# Patient Record
Sex: Male | Born: 1960 | Race: White | Hispanic: No | Smoking: Current every day smoker
Health system: Southern US, Community
[De-identification: ages and names within clinical notes are randomized; demographics above are authoritative.]

## PROBLEM LIST (undated history)

## (undated) DIAGNOSIS — E785 Hyperlipidemia, unspecified: Secondary | ICD-10-CM

## (undated) DIAGNOSIS — I2510 Atherosclerotic heart disease of native coronary artery without angina pectoris: Secondary | ICD-10-CM

## (undated) DIAGNOSIS — I10 Essential (primary) hypertension: Secondary | ICD-10-CM

## (undated) HISTORY — PX: CORONARY ANGIOPLASTY WITH STENT PLACEMENT: SHX49

---

## 1998-08-10 HISTORY — PX: TOTAL HIP ARTHROPLASTY: SHX124

## 2011-08-23 ENCOUNTER — Inpatient Hospital Stay: Payer: Self-pay | Admitting: Internal Medicine

## 2011-08-23 LAB — COMPREHENSIVE METABOLIC PANEL
Anion Gap: 15 (ref 7–16)
BUN: 16 mg/dL (ref 7–18)
Chloride: 101 mmol/L (ref 98–107)
Co2: 23 mmol/L (ref 21–32)
EGFR (African American): >60
EGFR (Non-African Amer.): >60
Glucose: 121 mg/dL — ABNORMAL HIGH (ref 65–99)
Osmolality: 280 (ref 275–301)
SGOT(AST): 34 U/L (ref 15–37)
SGPT (ALT): 52 U/L

## 2011-08-23 LAB — URINALYSIS, COMPLETE
Bacteria: NONE SEEN
Glucose,UR: NEGATIVE mg/dL (ref 0–75)
Hyaline Cast: 1
Ketone: NEGATIVE
Leukocyte Esterase: NEGATIVE
Nitrite: NEGATIVE
Ph: 6 (ref 4.5–8.0)
Specific Gravity: 1.003 (ref 1.003–1.030)

## 2011-08-23 LAB — CBC
HCT: 44.6 % (ref 40.0–52.0)
HGB: 15.8 g/dL (ref 13.0–18.0)
MCH: 32 pg (ref 26.0–34.0)
RBC: 4.93 10*6/uL (ref 4.40–5.90)
RDW: 13.7 % (ref 11.5–14.5)
WBC: 15.1 10*3/uL — ABNORMAL HIGH (ref 3.8–10.6)

## 2011-08-23 LAB — CK TOTAL AND CKMB (NOT AT ARMC)
CK, Total: 193 U/L (ref 35–232)
CK, Total: 438 U/L — ABNORMAL HIGH (ref 35–232)
CK-MB: 32.5 ng/mL — ABNORMAL HIGH (ref 0.5–3.6)

## 2011-08-23 LAB — TROPONIN I
Troponin-I: <0.02 ng/mL
Troponin-I: 5 ng/mL — ABNORMAL HIGH

## 2011-08-23 LAB — APTT: Activated PTT: 38.9 secs — ABNORMAL HIGH (ref 23.6–35.9)

## 2012-04-10 ENCOUNTER — Emergency Department: Payer: Self-pay | Admitting: Emergency Medicine

## 2012-04-10 LAB — URINALYSIS, COMPLETE
Ketone: NEGATIVE
Leukocyte Esterase: NEGATIVE
Nitrite: NEGATIVE
Ph: 6 (ref 4.5–8.0)
Protein: NEGATIVE
RBC,UR: 10 /HPF (ref 0–5)

## 2012-04-10 LAB — COMPREHENSIVE METABOLIC PANEL
Albumin: 3.8 g/dL (ref 3.4–5.0)
Alkaline Phosphatase: 69 U/L (ref 50–136)
Anion Gap: 8 (ref 7–16)
Calcium, Total: 8.6 mg/dL (ref 8.5–10.1)
Chloride: 109 mmol/L — ABNORMAL HIGH (ref 98–107)
Co2: 24 mmol/L (ref 21–32)
EGFR (African American): >60
Osmolality: 282 (ref 275–301)
Potassium: 3.5 mmol/L (ref 3.5–5.1)
SGOT(AST): 27 U/L (ref 15–37)
SGPT (ALT): 40 U/L (ref 12–78)
Sodium: 141 mmol/L (ref 136–145)
Total Protein: 7.1 g/dL (ref 6.4–8.2)

## 2012-04-10 LAB — CBC
HCT: 41.7 % (ref 40.0–52.0)
HGB: 14.8 g/dL (ref 13.0–18.0)
MCH: 31.2 pg (ref 26.0–34.0)
MCHC: 35.4 g/dL (ref 32.0–36.0)
MCV: 88 fL (ref 80–100)
Platelet: 280 10*3/uL (ref 150–440)
RBC: 4.74 10*6/uL (ref 4.40–5.90)
WBC: 10.2 10*3/uL (ref 3.8–10.6)

## 2012-04-10 LAB — PROTIME-INR: Prothrombin Time: 12.1 secs (ref 11.5–14.7)

## 2012-04-17 ENCOUNTER — Emergency Department: Payer: Self-pay | Admitting: Emergency Medicine

## 2012-04-17 LAB — COMPREHENSIVE METABOLIC PANEL
Alkaline Phosphatase: 58 U/L (ref 50–136)
Anion Gap: 8 (ref 7–16)
Bilirubin,Total: 0.5 mg/dL (ref 0.2–1.0)
Chloride: 106 mmol/L (ref 98–107)
Co2: 27 mmol/L (ref 21–32)
Creatinine: 1.11 mg/dL (ref 0.60–1.30)
EGFR (Non-African Amer.): >60
Glucose: 102 mg/dL — ABNORMAL HIGH (ref 65–99)
Osmolality: 284 (ref 275–301)
Potassium: 3.8 mmol/L (ref 3.5–5.1)
SGOT(AST): 20 U/L (ref 15–37)
Sodium: 141 mmol/L (ref 136–145)

## 2012-04-17 LAB — URINALYSIS, COMPLETE
Bacteria: NONE SEEN
Bilirubin,UR: NEGATIVE
Glucose,UR: NEGATIVE mg/dL (ref 0–75)
Ketone: NEGATIVE
Leukocyte Esterase: NEGATIVE
Nitrite: NEGATIVE
RBC,UR: 6 /HPF (ref 0–5)
Specific Gravity: 1.02 (ref 1.003–1.030)
Squamous Epithelial: NONE SEEN
WBC UR: 1 /HPF (ref 0–5)

## 2012-04-17 LAB — CBC
HCT: 44.5 % (ref 40.0–52.0)
HGB: 15.2 g/dL (ref 13.0–18.0)
MCH: 30.8 pg (ref 26.0–34.0)
MCHC: 34.2 g/dL (ref 32.0–36.0)
MCV: 90 fL (ref 80–100)
Platelet: 314 10*3/uL (ref 150–440)
WBC: 17.4 10*3/uL — ABNORMAL HIGH (ref 3.8–10.6)

## 2014-12-02 NOTE — Consult Note (Signed)
PATIENT NAME:  Anthony Mcguire, Anthony Mcguire MR#:  [id number] DATE OF BIRTH:  [birth year]  DATE OF CONSULTATION:  08/23/2011  REFERRING PHYSICIAN:   CONSULTING PHYSICIAN:  Laurier NancyShaukat A. Barba Solt, MD  HISTORY OF PRESENT ILLNESS: This is a 54 year old white male from [place] who came in with substernal chest pain described as pressure-type associated with shortness of breath and diaphoresis. It was 6/10 chest pain. Initially he was having burning then pressure-type.   ALLERGIES: No known drug allergies.   SOCIAL HISTORY: He smokes 1 pack per day. He denies EtOH abuse.   FAMILY HISTORY: Positive for coronary artery disease. His grandfather had a myocardial infarction at a young age, in his 6940s.   PHYSICAL EXAMINATION:   GENERAL: He is alert and oriented x3, in no acute distress.   VITAL SIGNS: Stable.   NECK: No JVD.   LUNGS: Clear.   HEART: Regular rate and rhythm. Normal S1 and S2. No murmur.   ABDOMEN: Soft and nontender, positive bowel sounds.   EXTREMITIES: No pedal edema.   NEUROLOGIC: Appears to be intact.   LABS/STUDIES: EKG shows sinus bradycardia at 55 beats per minute, early repolarization abnormality. Initial EKG had ST elevation at 3 mm which was read as early repolarization; however, it is probably related to acute myocardial infarction. Follow-up EKG still has persistent ST elevations.   The initial troponin was negative, but the second set came back as CPK 438, MB 32.5 and troponin 5./  ASSESSMENT AND PLAN: Probably the patient is having STEMI. I advised transferring the patient to [facility]. ____________________________ Laurier NancyShaukat A. Dreonna Hussein, MD sak:slb D: 08/23/2011 13:06:29 ET T: 08/23/2011 13:36:05 ET JOB#: [id number]  cc: Laurier NancyShaukat A. Eldridge Marcott, MD, <Dictator> Laurier NancySHAUKAT A [place] Lesure MD ELECTRONICALLY SIGNED 09/08/2011 9:00

## 2014-12-02 NOTE — Consult Note (Signed)
    General Aspect Consult dictated,[id number] came with chest pain and initial troponin was normal, and second set had troponin of 5, thus asked to evaluate. He is still having ongoing chest pain, and initial EKG had more than 3 mm Anteroseptal st elevation with f/u EKGS not much changed. Patient is having STEMI. [facility] and activated STEMI protocol. Also spoke to Dr. Dava NajjarPanwar and will transfer ASAP. Also started asp 325.plavix 600, IV heparin, and integrilin.    No Known Allergies:   Electronic Signatures: Radene KneeKhan, Tabor Bartram Ali (MD)  (Signed 13-Jan-13 13:30)  Authored: General Aspect/Present Illness, Allergies   Last Updated: 13-Jan-13 13:30 by Radene KneeKhan, Reyna Lorenzi Ali (MD)

## 2014-12-02 NOTE — H&P (Signed)
PATIENT NAME:  Anthony Mcguire, Anthony Mcguire MR#:  [id number] DATE OF BIRTH:  1960-11-16  DATE OF ADMISSION:  08/23/2011  PRIMARY CARE PHYSICIAN: Unassigned.  EMERGENCY ROOM PHYSICIAN: Dr. Marilynne Halsted  CHIEF COMPLAINT: Chest pain.   HISTORY OF PRESENT ILLNESS: The patient is a 54 year old male who presents with the chief complaint of substernal chest pain localized more on the right side. Symptoms began about 3:30 in the morning on exertion associated with some shortness of breath. The patient's pain radiated to the right shoulder, severity 6/10. The patient denies any nausea, diaphoresis, or dizziness. He denies palpitations. The chest pain was constant. The patient denies any other aggravating or alleviating factors.   ALLERGIES: No known drug allergies.   CURRENT MEDICATIONS: None.   PAST MEDICAL HISTORY: History of hypertension. The patient stopped taking blood pressure medications one year ago.   SOCIAL HISTORY: The patient is a smoker of one pack per day. He denies alcohol abuse or drug abuse. He works with [employer].   FAMILY HISTORY: The patient's mother is 57 years and is healthy. Father is in his 71s and has hypertension.   REVIEW OF SYSTEMS: CONSTITUTIONAL: The patient denies fevers, chills, or night sweats. HEENT: The patient denies any hearing loss, dysphagia, visual problems, or sore throat. CARDIOVASCULAR: Denies any chest pain, orthopnea, or PND. RESPIRATORY: The patient denies any cough, wheezing, or hemoptysis. GI: The patient denies any nausea, vomiting, abdominal pain, hematemesis, hematochezia, or melena. GU: The patient denies any hematuria, dysuria, or frequency. NEUROLOGIC: The patient denies any headache, focal weakness, or seizures. SKIN: The patient denies lesions or rash. ENDOCRINE: The patient denies polyuria, polyphagia, or polydipsia. MUSCULOSKELETAL: The patient denies any arthritis, joint effusion, or swelling. HEMATOLOGICAL: The patient denies any easy bleeding or bruises.    PHYSICAL EXAMINATION:   VITAL SIGNS: Heart rate 48, respiratory rate 18, blood pressure 127/79, and oxygen saturation 99%.   HEENT: Atraumatic, normocephalic. Pupils are equally round and reactive to light and accommodation. Extraocular movements are intact. Sclerae anicteric. Mucous membranes moist.   NECK: Supple. No organomegaly.   HEART: S1 and S2 regular rate and rhythm. No gallops. No thrills. No murmurs.   RESPIRATORY: Lungs are clear to auscultation. No rales, no rhonchi, no wheezes, and no bronchial breath sounds.   GI: Abdomen is soft, nontender, and nondistended. Normal bowel sounds. No hepatosplenomegaly.   GU: No hematuria or masses are noted.   SKIN: No lesions, no rash.   ENDOCRINE: No masses and no thyromegaly is noted.   LYMPH: No lymphadenopathy or nodes palpable.   NEUROLOGIC: Cranial nerves II through XII grossly intact. Motor strength 5 out of 5 in bilateral upper and lower extremities. Sensation is within normal limits. No focal neurological deficits are noted on examination.   MUSCULOSKELETAL: No arthritis, joint effusion, or swelling.   HEMATOLOGICAL: No ecchymosis, no bleeding, and no petechiae.   EXTREMITIES: No cyanosis, no clubbing, and no edema. 2+ pedal pulses noted bilaterally.   LABS/STUDIES: Chest x-ray is negative, per emergency room report.   Electrocardiogram: Normal sinus rhythm, 82 beats per minute, nonspecific ST-T wave changes.   Urinalysis shows 4 RBC's.   Total CK 193, CK-MB 1.2. Glucose 121, BUN 16, creatinine 1.13, sodium 139, potassium 3.6, chloride 101, CO2 23, calcium 8.4, total bilirubin 0.6, alkaline phosphatase 75, ALT 52, AST 34, total protein 7.6, and albumin 4.0. Estimated GFR greater than 60.   ASSESSMENT AND PLAN: The patient is a 54 year old male who presents with the chief complaint of chest pain: We will  admit to telemetry. Start the patient on chest pain clinical pathway orders, aspirin, and Lopressor. Check serial  cardiac enzymes and troponins. Echo. Myoview stress test. Cardiology consultation.  ____________________________ Donia AstJignesh S. Anan Dapolito, MD jsp:slb D: 08/23/2011 07:07:53 ET T: 08/23/2011 09:31:11 ET JOB#: [id number]  cc: Donia AstJignesh S. Rossi Silvestro, MD, <Dictator> Donia AstJIGNESH S Jaime Grizzell MD ELECTRONICALLY SIGNED 08/23/2011 22:09

## 2014-12-02 NOTE — Discharge Summary (Signed)
PATIENT NAME:  Anthony Mcguire, Anthony MR#:  [id number] DATE OF BIRTH:  10-Dec-1960  DATE OF ADMISSION:  08/23/2011 DATE OF DISCHARGE:  08/23/2011  DISCHARGE DIAGNOSES:  1. ST-segment elevation myocardial infarction. 2. Hypertension.  3. Extensive history of smoking. 4. Family history of heart disease. 5. Gastroesophageal reflux disease. 6. Hyperglycemia.   DISPOSITION: The patient is being [facility] for treatment of his STEMI and for angioplasty.  CONSULTANT: Adrian BlackwaterShaukat Khan, MD - Cardiology.  RESULTS: Chest x-ray showed no acute cardiopulmonary disease.  Urinalysis showed no evidence of infection.   CBC is normal other than white count of 15.1. First CK and troponin were negative. The second CK was 438 and troponin 5. Basic metabolic panel essentially normal, other than glucose of 121.  DISCHARGE MEDICATIONS: 1. Plavix 600 mg p.o. x1 dose. 2. Heparin drip after bolus.  3. Tylenol 650 mg every 4 hours p.r.n.  4. Xanax 0.25 mg every 8 hours p.r.n.  5. Maalox 30 mL every 4 hours p.r.n. indigestion.  6. Aspirin 325 mg daily.  7. Colace 100 mg twice a day. 8. Lisinopril 10 mg daily.  9. Milk of Magnesia 30 mL p.r.n. for constipation.  10. Lopressor 12.5 mg twice a day. 11. Nicotine patch 21 mg daily.  12. Nitro patch 1 inch every 6 hours. 13. Nitroglycerin p.r.n.  14. Omeprazole 20 mg twice a day. 15. Phenergan 12.5 mg IV every 4 to 6 hours p.r.n.  16. Simvastatin 20 mg daily.  17. Ambien 10 mg at bedtime p.r.n. for insomnia.   HOSPITAL COURSE: The patient is a 54 year old male with history of hypertension and smoking. The patient is originally from [place] and has been in the [place]. He was diagnosed with hypertension four years ago and started taking medications about one year ago, which were prescribed by his physician in [place]. He does not remember the name of the medications. He reports that he had a stress test four years ago  in [place]. He smokes two packs per day. His grandfather had a myocardial infarction in his 7240s. He presented with chest pain described as heartburn. He gave history of occasional gastroesophageal reflux disease/heartburn for which he took Zantac as needed. He had eaten some food yesterday which normally causes indigestion, however, he came to the hospital because his symptoms did not resolve. The patient was admitted to the hospital given his risk factors. His first set of troponin was negative. However, his second set of troponin came positive at 5. The patient was started on aspirin, beta blocker, ACE inhibitor, statin, and nitro paste, and he was bolused with 5000 units of heparin and started on a heparin drip. A cardiology consultation with Dr. Adrian BlackwaterShaukat Khan was also obtained. The patient was evaluated by Dr. Adrian BlackwaterShaukat Khan who reviewed his EKG and noted that the patient had 4 mm ST elevation. He thought the patient was having a ST-segment elevation myocardial infarction therefore [facility] was contacted. [facility]. He has been given a loading dose of Plavix.   TIME SPENT: 60 minutes.  ____________________________ Darrick MeigsSangeeta Braelon Sprung, MD sp:slb D: 08/23/2011 13:50:19 ET    T: 08/23/2011 13:58:18 ET       JOB#: [id number] cc: Darrick MeigsSangeeta Shaquila Sigman, MD, <Dictator> Darrick MeigsSANGEETA Johnnisha Forton MD ELECTRONICALLY SIGNED 08/23/2011 20:02

## 2017-03-18 ENCOUNTER — Emergency Department: Payer: Self-pay

## 2017-03-18 ENCOUNTER — Encounter: Payer: Self-pay | Admitting: Emergency Medicine

## 2017-03-18 ENCOUNTER — Emergency Department
Admission: EM | Admit: 2017-03-18 | Discharge: 2017-03-18 | Disposition: A | Discharge status: Home / Self Care | Payer: Self-pay | Attending: Emergency Medicine | Admitting: Emergency Medicine

## 2017-03-18 DIAGNOSIS — R1011 Right upper quadrant pain: Secondary | ICD-10-CM

## 2017-03-18 DIAGNOSIS — M62830 Muscle spasm of back: Secondary | ICD-10-CM | POA: Insufficient documentation

## 2017-03-18 DIAGNOSIS — S335XXA Sprain of ligaments of lumbar spine, initial encounter: Secondary | ICD-10-CM

## 2017-03-18 DIAGNOSIS — I2510 Atherosclerotic heart disease of native coronary artery without angina pectoris: Secondary | ICD-10-CM | POA: Insufficient documentation

## 2017-03-18 DIAGNOSIS — K8020 Calculus of gallbladder without cholecystitis without obstruction: Secondary | ICD-10-CM | POA: Insufficient documentation

## 2017-03-18 DIAGNOSIS — F17200 Nicotine dependence, unspecified, uncomplicated: Secondary | ICD-10-CM | POA: Insufficient documentation

## 2017-03-18 DIAGNOSIS — R319 Hematuria, unspecified: Secondary | ICD-10-CM | POA: Insufficient documentation

## 2017-03-18 HISTORY — DX: Atherosclerotic heart disease of native coronary artery without angina pectoris: I25.10

## 2017-03-18 LAB — HEPATIC FUNCTION PANEL
ALBUMIN: 4.1 g/dL (ref 3.5–5.0)
ALT: 29 U/L (ref 17–63)
AST: 24 U/L (ref 15–41)
Alkaline Phosphatase: 56 U/L (ref 38–126)
Bilirubin, Direct: 0.1 mg/dL (ref 0.1–0.5)
Indirect Bilirubin: 0.8 mg/dL (ref 0.3–0.9)
TOTAL PROTEIN: 7.1 g/dL (ref 6.5–8.1)
Total Bilirubin: 0.9 mg/dL (ref 0.3–1.2)

## 2017-03-18 LAB — CBC WITH DIFFERENTIAL/PLATELET
BASOS ABS: 0 10*3/uL (ref 0–0.1)
BASOS PCT: 0 %
EOS ABS: 0.2 10*3/uL (ref 0–0.7)
Eosinophils Relative: 3 %
HEMATOCRIT: 41.3 % (ref 40.0–52.0)
HEMOGLOBIN: 14.3 g/dL (ref 13.0–18.0)
Lymphocytes Relative: 42 %
Lymphs Abs: 3.6 10*3/uL (ref 1.0–3.6)
MCH: 30.4 pg (ref 26.0–34.0)
MCHC: 34.7 g/dL (ref 32.0–36.0)
MCV: 87.6 fL (ref 80.0–100.0)
MONOS PCT: 13 %
Monocytes Absolute: 1.1 10*3/uL — ABNORMAL HIGH (ref 0.2–1.0)
NEUTROS ABS: 3.5 10*3/uL (ref 1.4–6.5)
NEUTROS PCT: 42 %
Platelets: 299 10*3/uL (ref 150–440)
RBC: 4.72 MIL/uL (ref 4.40–5.90)
RDW: 13.7 % (ref 11.5–14.5)
WBC: 8.4 10*3/uL (ref 3.8–10.6)

## 2017-03-18 LAB — BASIC METABOLIC PANEL
ANION GAP: 8 (ref 5–15)
BUN: 14 mg/dL (ref 6–20)
CALCIUM: 9.2 mg/dL (ref 8.9–10.3)
CO2: 25 mmol/L (ref 22–32)
CREATININE: 1.19 mg/dL (ref 0.61–1.24)
Chloride: 106 mmol/L (ref 101–111)
GFR calc non Af Amer: >60 mL/min (ref >60)
Glucose, Bld: 87 mg/dL (ref 65–99)
Potassium: 4 mmol/L (ref 3.5–5.1)
SODIUM: 139 mmol/L (ref 135–145)

## 2017-03-18 LAB — URINALYSIS, COMPLETE (UACMP) WITH MICROSCOPIC
BACTERIA UA: NONE SEEN
BILIRUBIN URINE: NEGATIVE
Glucose, UA: NEGATIVE mg/dL
KETONES UR: NEGATIVE mg/dL
LEUKOCYTES UA: NEGATIVE
Nitrite: NEGATIVE
PH: 6 (ref 5.0–8.0)
PROTEIN: NEGATIVE mg/dL
SQUAMOUS EPITHELIAL / LPF: NONE SEEN
Specific Gravity, Urine: 1.015 (ref 1.005–1.030)

## 2017-03-18 LAB — LIPASE, BLOOD: LIPASE: 38 U/L (ref 11–51)

## 2017-03-18 MED ORDER — CYCLOBENZAPRINE HCL 10 MG PO TABS: 10.0000 mg | ORAL_TABLET | Freq: Three times a day (TID) | ORAL | 0 refills | Status: AC | PRN

## 2017-03-18 MED ORDER — KETOROLAC TROMETHAMINE 30 MG/ML IJ SOLN
INTRAMUSCULAR | Status: AC
  Administered 2017-03-18: 15 mg via INTRAVENOUS
  Filled 2017-03-18: qty 1

## 2017-03-18 MED ORDER — MORPHINE SULFATE (PF) 4 MG/ML IV SOLN
4.0000 mg | Freq: Once | INTRAVENOUS | Status: AC
  Administered 2017-03-18: 4 mg via INTRAVENOUS

## 2017-03-18 MED ORDER — ONDANSETRON HCL 4 MG/2ML IJ SOLN
4.0000 mg | Freq: Once | INTRAMUSCULAR | Status: AC
  Administered 2017-03-18: 4 mg via INTRAVENOUS

## 2017-03-18 MED ORDER — ONDANSETRON HCL 4 MG/2ML IJ SOLN
INTRAMUSCULAR | Status: AC
  Administered 2017-03-18: 4 mg via INTRAVENOUS
  Filled 2017-03-18: qty 2

## 2017-03-18 MED ORDER — KETOROLAC TROMETHAMINE 30 MG/ML IJ SOLN
15.0000 mg | Freq: Once | INTRAMUSCULAR | Status: AC
  Administered 2017-03-18: 15 mg via INTRAVENOUS

## 2017-03-18 MED ORDER — DICLOFENAC SODIUM 1 % TD GEL: 2.0000 g | Freq: Four times a day (QID) | TRANSDERMAL | 0 refills | Status: AC

## 2017-03-18 MED ORDER — MORPHINE SULFATE (PF) 4 MG/ML IV SOLN
INTRAVENOUS | Status: AC
  Administered 2017-03-18: 4 mg via INTRAVENOUS
  Filled 2017-03-18: qty 1

## 2017-03-18 NOTE — ED Triage Notes (Signed)
Pt with left sided flank pain for two days.

## 2017-03-18 NOTE — ED Notes (Signed)

## 2017-03-18 NOTE — ED Provider Notes (Signed)
[facility] Emergency Department Provider Note  ____________________________________________  Time seen: Approximately 8:22 PM  I have reviewed the triage vital signs and the nursing notes.   HISTORY  Chief Complaint Flank Pain   HPI Baran Kuhrt is a 56 y.o. male with a history of coronary artery disease and presents for evaluation of left flank pain and RUQ abdominal pain. Patient reports several episodes of cramping sharp sudden right upper quadrant abdominal pain usually worse postprandially that has been happening for several months. Patient currently endorses mild right upper quadrant pain. Patient also complaining of left middle back/flank pain. Patient has a right hip replacement and according to him since having that done he walks crooked. He has been walking a lot in the last few days prior to developing the left back pain. He is complaining of 6 out of 10 dull pain on his L back that is worse with palpation and movement. No SOB, no CP, no personal or family history of blood clots, no recent travel immobilization, no leg pain or swelling, no hemoptysis, no exogenous hormones. Patient reports that he was told many years ago by his doctor in [place] that he had blood in his urine. He had an extensive workup which was unrevealing. He denies prior history of kidney stone. He denies prior abdominal surgeries. He denies dysuria or hematuria. No fever or chills. No nausea or vomiting. No constipation or diarrhea.  Past Medical History:  Diagnosis Date  . Coronary artery disease     There are no active problems to display for this patient.   History reviewed. No pertinent surgical history.  Prior to Admission medications   Medication Sig Start Date End Date Taking? Authorizing Provider  cyclobenzaprine (FLEXERIL) 10 MG tablet Take 1 tablet (10 mg total) by mouth 3 (three) times daily as needed for muscle spasms. 03/18/17   Nita Sickle, MD  diclofenac  sodium (VOLTAREN) 1 % GEL Apply 2 g topically 4 (four) times daily. 03/18/17   Nita Sickle, MD    Allergies Patient has no known allergies.  History reviewed. No pertinent family history.  Social History Social History  Substance Use Topics  . Smoking status: Current Some Day Smoker  . Smokeless tobacco: Never Used  . Alcohol use Yes    Review of Systems  Constitutional: Negative for fever. Eyes: Negative for visual changes. ENT: Negative for sore throat. Neck: No neck pain  Cardiovascular: Negative for chest pain. Respiratory: Negative for shortness of breath. Gastrointestinal: + RUQ abdominal pain. No vomiting or diarrhea. Genitourinary: Negative for dysuria. + L flank pain Musculoskeletal: Negative for back pain. Skin: Negative for rash. Neurological: Negative for headaches, weakness or numbness. Psych: No SI or HI  ____________________________________________   PHYSICAL EXAM:  VITAL SIGNS: ED Triage Vitals  Enc Vitals Group     BP 03/18/17 1804 (!) 149/82     Pulse Rate 03/18/17 1804 66     Resp 03/18/17 1804 18     Temp 03/18/17 1804 98.5 F (36.9 C)     Temp Source 03/18/17 1804 Oral     SpO2 03/18/17 1804 98 %     Weight 03/18/17 1804 235 lb (106.6 kg)     Height 03/18/17 1804 5\' 11"  (1.803 m)     Head Circumference --      Peak Flow --      Pain Score 03/18/17 1803 7     Pain Loc --      Pain Edu? --  Excl. in GC? --     Constitutional: Alert and oriented. Well appearing and in no apparent distress. HEENT:      Head: Normocephalic and atraumatic.         Eyes: Conjunctivae are normal. Sclera is non-icteric.       Mouth/Throat: Mucous membranes are moist.       Neck: Supple with no signs of meningismus. Cardiovascular: Regular rate and rhythm. No murmurs, gallops, or rubs. 2+ symmetrical distal pulses are present in all extremities. No JVD. Respiratory: Normal respiratory effort. Lungs are clear to auscultation bilaterally. No wheezes,  crackles, or rhonchi.  Gastrointestinal: Soft, ttp over the right upper quadrant with negative Murphy sign, and non distended with positive bowel sounds. No rebound or guarding. Genitourinary: L CVA tenderness. Musculoskeletal: Nontender with normal range of motion in all extremities. No edema, cyanosis, or erythema of extremities. Neurologic: Normal speech and language. Face is symmetric. Moving all extremities. No gross focal neurologic deficits are appreciated. Skin: Skin is warm, dry and intact. No rash noted. Psychiatric: Mood and affect are normal. Speech and behavior are normal.  ____________________________________________   LABS (all labs ordered are listed, but only abnormal results are displayed)  Labs Reviewed  URINALYSIS, COMPLETE (UACMP) WITH MICROSCOPIC - Abnormal; Notable for the following:       Result Value   Color, Urine YELLOW (*)    APPearance CLEAR (*)    Hgb urine dipstick MODERATE (*)    All other components within normal limits  CBC WITH DIFFERENTIAL/PLATELET - Abnormal; Notable for the following:    Monocytes Absolute 1.1 (*)    All other components within normal limits  BASIC METABOLIC PANEL  HEPATIC FUNCTION PANEL  LIPASE, BLOOD   ____________________________________________  EKG  none  ____________________________________________  RADIOLOGY  CT renal: 1. No evidence of acute abnormality. 2. Hepatic steatosis. 3. Gallbladder sludge versus cholelithiasis. No CT evidence of acute cholecystitis. 4. Nonobstructing 3 mm right renal calculus. 5. Aortic Atherosclerosis (ICD10-I70.0).  RUQ [place]: Cholelithiasis without other evidence of acute cholecystitis. ____________________________________________   PROCEDURES  Procedure(s) performed: None Procedures Critical Care performed:  None ____________________________________________   INITIAL IMPRESSION / ASSESSMENT AND PLAN / ED COURSE  56 y.o. male with a history of coronary artery disease and  presents for evaluation of left flank pain and RUQ abdominal pain.  #L flank pain: Hematuria seen on UA however patient has a h/o hematuria in the past. CT renal showing R renal stone, no abnormalities on the L. Pain reproducible on palpation and I do believe pain is MSK in nature vs possible passed L sided kidney stone since patient's pain has now resolved after pain meds. Will refer to Urology for evaluation of hematuria. No suspicion of PE with non pleuritic pain, no SOB, no CP, no tachycardia, no tachypnea, no risk factors.  #RUQ abdominal pain: CT concerning for cholelithiasis, LFTs and lipase pending. Will send for RUQ [place].     _________________________ 9:34 PM on 03/18/2017 ----------------------------------------- US showing cholelithiasis with no cholecystitis. Will refer to Dr. Excell Seltzer for outpatient evaluation. Referral to Dr. Apolinar Junes for hematuria eval. Will give flexeril and voltaren gel for muscle spasms of the back. Discussed return precautions with patient.   Pertinent labs & imaging results that were available during my care of the patient were reviewed by me and considered in my medical decision making (see chart for details).    ____________________________________________   FINAL CLINICAL IMPRESSION(S) / ED DIAGNOSES  Final diagnoses:  RUQ abdominal pain  Calculus  of gallbladder without cholecystitis without obstruction  Lumbar sprain, initial encounter  Hematuria, unspecified type      NEW MEDICATIONS STARTED DURING THIS VISIT:  New Prescriptions   CYCLOBENZAPRINE (FLEXERIL) 10 MG TABLET    Take 1 tablet (10 mg total) by mouth 3 (three) times daily as needed for muscle spasms.   DICLOFENAC SODIUM (VOLTAREN) 1 % GEL    Apply 2 g topically 4 (four) times daily.     Note:  This document was prepared using Dragon voice recognition software and may include unintentional dictation errors.    Nita SickleVeronese, [place], MD 03/18/17 2136

## 2017-03-19 ENCOUNTER — Emergency Department
Admission: EM | Admit: 2017-03-19 | Discharge: 2017-03-20 | Disposition: A | Discharge status: Home / Self Care | Payer: Self-pay | Attending: Emergency Medicine | Admitting: Emergency Medicine

## 2017-03-19 DIAGNOSIS — R1032 Left lower quadrant pain: Secondary | ICD-10-CM | POA: Insufficient documentation

## 2017-03-19 DIAGNOSIS — I2510 Atherosclerotic heart disease of native coronary artery without angina pectoris: Secondary | ICD-10-CM | POA: Insufficient documentation

## 2017-03-19 DIAGNOSIS — R3129 Other microscopic hematuria: Secondary | ICD-10-CM | POA: Insufficient documentation

## 2017-03-19 DIAGNOSIS — R109 Unspecified abdominal pain: Secondary | ICD-10-CM

## 2017-03-19 LAB — CBC
HCT: 41.8 % (ref 40.0–52.0)
Hemoglobin: 14.4 g/dL (ref 13.0–18.0)
MCH: 30.3 pg (ref 26.0–34.0)
MCHC: 34.4 g/dL (ref 32.0–36.0)
MCV: 88 fL (ref 80.0–100.0)
PLATELETS: 303 10*3/uL (ref 150–440)
RBC: 4.74 MIL/uL (ref 4.40–5.90)
RDW: 14 % (ref 11.5–14.5)
WBC: 8.5 10*3/uL (ref 3.8–10.6)

## 2017-03-19 LAB — BASIC METABOLIC PANEL
Anion gap: 9 (ref 5–15)
BUN: 14 mg/dL (ref 6–20)
CALCIUM: 9.2 mg/dL (ref 8.9–10.3)
CO2: 24 mmol/L (ref 22–32)
CREATININE: 1.22 mg/dL (ref 0.61–1.24)
Chloride: 107 mmol/L (ref 101–111)
GFR calc non Af Amer: >60 mL/min (ref >60)
Glucose, Bld: 94 mg/dL (ref 65–99)
Potassium: 4.1 mmol/L (ref 3.5–5.1)
SODIUM: 140 mmol/L (ref 135–145)

## 2017-03-19 MED ORDER — FENTANYL CITRATE (PF) 100 MCG/2ML IJ SOLN
50.0000 ug | INTRAMUSCULAR | Status: DC | PRN
  Administered 2017-03-19: 50 ug via NASAL
  Filled 2017-03-19: qty 2

## 2017-03-19 NOTE — ED Triage Notes (Signed)
Pt with left flank pain that began today. Pt states "the pain is unbearable". Pt with history of right renal calculi diagnosed yesterday per pt.

## 2017-03-20 ENCOUNTER — Emergency Department: Payer: Self-pay

## 2017-03-20 ENCOUNTER — Encounter: Payer: Self-pay | Admitting: Radiology

## 2017-03-20 LAB — URINALYSIS, COMPLETE (UACMP) WITH MICROSCOPIC
BILIRUBIN URINE: NEGATIVE
Bacteria, UA: NONE SEEN
GLUCOSE, UA: NEGATIVE mg/dL
KETONES UR: NEGATIVE mg/dL
LEUKOCYTES UA: NEGATIVE
NITRITE: NEGATIVE
PH: 7 (ref 5.0–8.0)
Protein, ur: NEGATIVE mg/dL
SPECIFIC GRAVITY, URINE: 1.017 (ref 1.005–1.030)
SQUAMOUS EPITHELIAL / LPF: NONE SEEN

## 2017-03-20 MED ORDER — ONDANSETRON 4 MG PO TBDP: 4.0000 mg | ORAL_TABLET | Freq: Three times a day (TID) | ORAL | 0 refills | Status: AC | PRN

## 2017-03-20 MED ORDER — IOPAMIDOL (ISOVUE-300) INJECTION 61%: 30.0000 mL | Freq: Once | INTRAVENOUS | Status: DC

## 2017-03-20 MED ORDER — KETOROLAC TROMETHAMINE 30 MG/ML IJ SOLN
10.0000 mg | Freq: Once | INTRAMUSCULAR | Status: AC
  Administered 2017-03-20: 9.9 mg via INTRAVENOUS
  Filled 2017-03-20: qty 1

## 2017-03-20 MED ORDER — ONDANSETRON HCL 4 MG/2ML IJ SOLN
4.0000 mg | Freq: Once | INTRAMUSCULAR | Status: AC
  Administered 2017-03-20: 4 mg via INTRAVENOUS
  Filled 2017-03-20: qty 2

## 2017-03-20 MED ORDER — IOPAMIDOL (ISOVUE-300) INJECTION 61%
100.0000 mL | Freq: Once | INTRAVENOUS | Status: AC | PRN
  Administered 2017-03-20: 100 mL via INTRAVENOUS

## 2017-03-20 MED ORDER — IBUPROFEN 800 MG PO TABS: 800.0000 mg | ORAL_TABLET | Freq: Three times a day (TID) | ORAL | 0 refills | Status: AC | PRN

## 2017-03-20 MED ORDER — HYDROMORPHONE HCL 1 MG/ML IJ SOLN
0.5000 mg | Freq: Once | INTRAMUSCULAR | Status: AC
  Administered 2017-03-20: 0.5 mg via INTRAVENOUS
  Filled 2017-03-20: qty 1

## 2017-03-20 MED ORDER — OXYCODONE-ACETAMINOPHEN 5-325 MG PO TABS: 1.0000 | ORAL_TABLET | ORAL | 0 refills | Status: AC | PRN

## 2017-03-20 MED ORDER — SODIUM CHLORIDE 0.9 % IV BOLUS (SEPSIS)
1000.0000 mL | Freq: Once | INTRAVENOUS | Status: AC
  Administered 2017-03-20: 1000 mL via INTRAVENOUS

## 2017-03-20 NOTE — Discharge Instructions (Signed)
1. You may take medicines as needed for pain and nausea (Percocet/Zofran #30). 2. Drink plenty of filtered or bottled water daily. 3. Return to the ER for worsening symptoms, persistent vomiting, difficulty breathing or other concerns.

## 2017-03-20 NOTE — ED Provider Notes (Signed)
[facility] Emergency Department Provider Note   ____________________________________________   First MD Initiated Contact with Patient 03/20/17 0011     (approximate)  I have reviewed the triage vital signs and the nursing notes.   HISTORY  Chief Complaint Flank Pain    HPI Anthony Mcguire is a 56 y.o. male who presents to the ED from home with a chief complaint of left flank pain.Reports a several day history of left flank pain. States he was seen in the ED yesterday with negative CT scan for kidney stone and sent home on muscle relaxers. CT and subsequent [place] did demonstrate cholelithiasis. Reports waxing/waning pain to left flank with associated nausea. Denies fever, chills, chest pain, shortness of breath, vomiting, hematuria, testicular pain or swelling. Denies recent travel or trauma. Denies similar pain previously.   Past Medical History CAD  There are no active problems to display for this patient.   No past surgical history on file.  Prior to Admission medications   Not on File    Allergies Patient has no known allergies.  Family History None for kidney stones  Social History Social History  Substance Use Topics  . Smoking status: Not on file  . Smokeless tobacco: Not on file  . Alcohol use Not on file    Review of Systems  Constitutional: No fever/chills. Eyes: No visual changes. ENT: No sore throat. Cardiovascular: Denies chest pain. Respiratory: Denies shortness of breath. Gastrointestinal: Positive for left flank pain. No abdominal pain.  Positive for nausea, no vomiting.  No diarrhea.  No constipation. Genitourinary: Negative for dysuria. Musculoskeletal: Negative for back pain. Skin: Negative for rash. Neurological: Negative for headaches, focal weakness or numbness.   ____________________________________________   PHYSICAL EXAM:  VITAL SIGNS: ED Triage Vitals  Enc Vitals Group     BP 03/19/17 2256 (!)  165/85     Pulse Rate 03/19/17 2256 74     Resp 03/19/17 2256 16     Temp 03/19/17 2256 98.3 F (36.8 C)     Temp Source 03/19/17 2256 Oral     SpO2 03/19/17 2256 100 %     Weight 03/19/17 2257 245 lb (111.1 kg)     Height 03/19/17 2257 5\' 11"  (1.803 m)     Head Circumference --      Peak Flow --      Pain Score 03/19/17 2256 10     Pain Loc --      Pain Edu? --      Excl. in GC? --     Constitutional: Alert and oriented. Well appearing and in mild acute distress. Eyes: Conjunctivae are normal. PERRL. EOMI. Head: Atraumatic. Nose: No congestion/rhinnorhea. Mouth/Throat: Mucous membranes are moist.  Oropharynx non-erythematous. Neck: No stridor.   Cardiovascular: Normal rate, regular rhythm. Grossly normal heart sounds.  Good peripheral circulation. Respiratory: Normal respiratory effort.  No retractions. Lungs CTAB. Gastrointestinal: Soft and nontender. No distention. No abdominal bruits. Mild left CVA tenderness. Musculoskeletal: No lower extremity tenderness nor edema.  No joint effusions. Neurologic:  Normal speech and language. No gross focal neurologic deficits are appreciated. No gait instability. Skin:  Skin is warm, dry and intact. No rash noted. No vesicles. Psychiatric: Mood and affect are normal. Speech and behavior are normal.  ____________________________________________   LABS (all labs ordered are listed, but only abnormal results are displayed)  Labs Reviewed  URINALYSIS, COMPLETE (UACMP) WITH MICROSCOPIC - Abnormal; Notable for the following:       Result Value  Color, Urine YELLOW (*)    APPearance CLEAR (*)    Hgb urine dipstick SMALL (*)    All other components within normal limits  BASIC METABOLIC PANEL  CBC   ____________________________________________  EKG  None ____________________________________________  RADIOLOGY  Ct Abdomen Pelvis W Contrast  Result Date: 03/20/2017 CLINICAL DATA:  Acute onset of left flank pain.  Initial  encounter. EXAM: CT ABDOMEN AND PELVIS WITH CONTRAST TECHNIQUE: Multidetector CT imaging of the abdomen and pelvis was performed using the standard protocol following bolus administration of intravenous contrast. CONTRAST:  100mL ISOVUE-300 IOPAMIDOL (ISOVUE-300) INJECTION 61% COMPARISON:  None. FINDINGS: Lower chest: The visualized lung bases are grossly clear. Mild coronary artery calcification is noted. Hepatobiliary: The liver is unremarkable in appearance. The gallbladder is unremarkable in appearance. The common bile duct remains normal in caliber. Pancreas: The pancreas is within normal limits. Spleen: The spleen is unremarkable in appearance. Adrenals/Urinary Tract: The adrenal glands are unremarkable in appearance. A right renal cyst is noted. Mild nonspecific perinephric stranding is noted bilaterally. A 4 mm nonobstructing stone is noted at the upper pole of the right kidney. There is no evidence of hydronephrosis. No obstructing ureteral stones are seen. Stomach/Bowel: The stomach is unremarkable in appearance. The small bowel is within normal limits. The appendix is normal in caliber, without evidence of appendicitis. The colon is unremarkable in appearance. Vascular/Lymphatic: Scattered calcification is seen along the abdominal aorta and its branches. The abdominal aorta is otherwise grossly unremarkable. There is likely moderate or severe luminal narrowing along the right common iliac artery. The inferior vena cava is grossly unremarkable. No retroperitoneal lymphadenopathy is seen. No pelvic sidewall lymphadenopathy is identified. Reproductive: The bladder is mildly distended and grossly unremarkable. The prostate remains normal in size. Other: No additional soft tissue abnormalities are seen. Musculoskeletal: No acute osseous abnormalities are identified. The patient's right hip arthroplasty is grossly intact, without evidence of loosening. Surrounding degenerative change is noted. The visualized  musculature is unremarkable in appearance. IMPRESSION: 1. No acute abnormality seen to explain the patient's symptoms. 2. Mild coronary artery calcification noted. 3. 4 mm nonobstructing stone at the upper pole of the right kidney. Right renal cyst noted. 4. Scattered aortic atherosclerosis. 5. Likely moderate or severe luminal narrowing along the right common iliac artery. Would correlate for any associated symptoms. Electronically Signed   By: Roanna RaiderJeffery  Chang M.D.   On: 03/20/2017 01:31    ____________________________________________   PROCEDURES  Procedure(s) performed: None  Procedures  Critical Care performed: No  ____________________________________________   INITIAL IMPRESSION / ASSESSMENT AND PLAN / ED COURSE  Pertinent labs & imaging results that were available during my care of the patient were reviewed by me and considered in my medical decision making (see chart for details).  56 year old male who returns to the ED for persistent left flank pain. CT renal colic study yesterday negative for left-sided stone. Laboratory results unremarkable. Microscopic hematuria noted on urinalysis which patient has had previously. Given patient's persistent pain, will obtain CT with IV contrast to evaluate for renal infarction.  Clinical Course as of Mar 20 153  Sat Mar 20, 2017  0149 Pain almost gone. Slightly nauseous. Updated patient and spouse of CT imaging results.Will treat like a kidney stone and discharge home on Percocet and Zofran as needed. Patient was referred yesterday to both urology as well as general surgery for cholelithiasis seen on CT scan. He will follow up as instructed. Strict return precautions given. Both verbalize understanding and agree with plan  of care.  [JS]    Clinical Course User Index [JS] Irean Hong, MD     ____________________________________________   FINAL CLINICAL IMPRESSION(S) / ED DIAGNOSES  Final diagnoses:  Left flank pain  Microscopic  hematuria      NEW MEDICATIONS STARTED DURING THIS VISIT:  New Prescriptions   No medications on file     Note:  This document was prepared using Dragon voice recognition software and may include unintentional dictation errors.    Irean Hong, MD 03/20/17 [phone]

## 2017-03-24 ENCOUNTER — Encounter: Payer: Self-pay | Admitting: Radiology

## 2017-08-10 DIAGNOSIS — K8020 Calculus of gallbladder without cholecystitis without obstruction: Secondary | ICD-10-CM

## 2017-08-10 DIAGNOSIS — N200 Calculus of kidney: Secondary | ICD-10-CM

## 2017-08-10 HISTORY — DX: Calculus of kidney: N20.0

## 2017-08-10 HISTORY — DX: Calculus of gallbladder without cholecystitis without obstruction: K80.20

## 2017-10-10 ENCOUNTER — Emergency Department: Payer: Self-pay

## 2017-10-10 ENCOUNTER — Encounter: Payer: Self-pay | Admitting: Emergency Medicine

## 2017-10-10 ENCOUNTER — Emergency Department
Admission: EM | Admit: 2017-10-10 | Discharge: 2017-10-10 | Disposition: A | Discharge status: Home / Self Care | Payer: Self-pay | Attending: Student in an Organized Health Care Education/Training Program | Admitting: Student in an Organized Health Care Education/Training Program

## 2017-10-10 DIAGNOSIS — Z955 Presence of coronary angioplasty implant and graft: Secondary | ICD-10-CM | POA: Insufficient documentation

## 2017-10-10 DIAGNOSIS — I10 Essential (primary) hypertension: Secondary | ICD-10-CM | POA: Insufficient documentation

## 2017-10-10 DIAGNOSIS — J40 Bronchitis, not specified as acute or chronic: Secondary | ICD-10-CM | POA: Insufficient documentation

## 2017-10-10 DIAGNOSIS — F17200 Nicotine dependence, unspecified, uncomplicated: Secondary | ICD-10-CM | POA: Insufficient documentation

## 2017-10-10 DIAGNOSIS — I2510 Atherosclerotic heart disease of native coronary artery without angina pectoris: Secondary | ICD-10-CM | POA: Insufficient documentation

## 2017-10-10 HISTORY — DX: Hyperlipidemia, unspecified: E78.5

## 2017-10-10 HISTORY — DX: Essential (primary) hypertension: I10

## 2017-10-10 LAB — BASIC METABOLIC PANEL
ANION GAP: 10 (ref 5–15)
BUN: 13 mg/dL (ref 6–20)
CO2: 22 mmol/L (ref 22–32)
Calcium: 8.9 mg/dL (ref 8.9–10.3)
Chloride: 104 mmol/L (ref 101–111)
Creatinine, Ser: 1.13 mg/dL (ref 0.61–1.24)
GFR calc Af Amer: >60 mL/min (ref >60)
GFR calc non Af Amer: >60 mL/min (ref >60)
GLUCOSE: 126 mg/dL — AB (ref 65–99)
POTASSIUM: 3.7 mmol/L (ref 3.5–5.1)
Sodium: 136 mmol/L (ref 135–145)

## 2017-10-10 LAB — CBC
HEMATOCRIT: 39.9 % — AB (ref 40.0–52.0)
HEMOGLOBIN: 13.5 g/dL (ref 13.0–18.0)
MCH: 29.3 pg (ref 26.0–34.0)
MCHC: 33.7 g/dL (ref 32.0–36.0)
MCV: 86.9 fL (ref 80.0–100.0)
Platelets: 337 10*3/uL (ref 150–440)
RBC: 4.59 MIL/uL (ref 4.40–5.90)
RDW: 14.3 % (ref 11.5–14.5)
WBC: 10.1 10*3/uL (ref 3.8–10.6)

## 2017-10-10 LAB — TROPONIN I: Troponin I: <0.03 ng/mL (ref <0.03)

## 2017-10-10 MED ORDER — DOXYCYCLINE HYCLATE 100 MG PO TABS
100.0000 mg | ORAL_TABLET | Freq: Once | ORAL | Status: AC
  Administered 2017-10-10: 100 mg via ORAL
  Filled 2017-10-10: qty 1

## 2017-10-10 MED ORDER — PREDNISONE 20 MG PO TABS
60.0000 mg | ORAL_TABLET | Freq: Once | ORAL | Status: AC
  Administered 2017-10-10: 60 mg via ORAL
  Filled 2017-10-10: qty 3

## 2017-10-10 MED ORDER — IOPAMIDOL (ISOVUE-370) INJECTION 76%
75.0000 mL | Freq: Once | INTRAVENOUS | Status: AC | PRN
  Administered 2017-10-10: 75 mL via INTRAVENOUS

## 2017-10-10 MED ORDER — PREDNISONE 20 MG PO TABS: 40.0000 mg | ORAL_TABLET | Freq: Every day | ORAL | 0 refills | Status: AC

## 2017-10-10 MED ORDER — IPRATROPIUM-ALBUTEROL 0.5-2.5 (3) MG/3ML IN SOLN
3.0000 mL | Freq: Once | RESPIRATORY_TRACT | Status: AC
  Administered 2017-10-10: 3 mL via RESPIRATORY_TRACT
  Filled 2017-10-10: qty 3

## 2017-10-10 MED ORDER — DOXYCYCLINE HYCLATE 100 MG PO TABS: 100.0000 mg | ORAL_TABLET | Freq: Two times a day (BID) | ORAL | 0 refills | Status: AC

## 2017-10-10 MED ORDER — ALBUTEROL SULFATE HFA 108 (90 BASE) MCG/ACT IN AERS: 2.0000 | INHALATION_SPRAY | Freq: Four times a day (QID) | RESPIRATORY_TRACT | 2 refills | Status: AC | PRN

## 2017-10-10 NOTE — ED Triage Notes (Signed)
Pt comes into the ED via POV c/o sharp pains in his chest and coughing up blood for the past week.  Patient has even and unlabored respirations at this time and appears in NAD. Patient has cardiac stents and is on plavix for them.

## 2017-10-10 NOTE — ED Provider Notes (Signed)
[facility] Emergency Department Provider Note    None    (approximate)  I have reviewed the triage vital signs and the nursing notes.   HISTORY  Chief Complaint Chest Pain and Hemoptysis    HPI Anthony Mcguire is a 57 y.o. male with a history of CAD on aspirin Plavix presents with chief complaint of several weeks of intermittent right-sided chest pain with shortness of breath and cough developing hemoptysis where he is coughing up streaks of blood.  Has also had sinus congestion as well as muscle aches.  No recent diagnosis of pneumonia.  He does smoke daily and has had a history of bronchitis.  No lower extremity swelling.  No sudden weight loss.  No night sweats.  No nausea or vomiting.  Past Medical History:  Diagnosis Date  . Coronary artery disease   . Hyperlipidemia   . Hypertension    No family history on file. Past Surgical History:  Procedure Laterality Date  . CORONARY ANGIOPLASTY WITH STENT PLACEMENT     There are no active problems to display for this patient.     Prior to Admission medications   Medication Sig Start Date End Date Taking? Authorizing Provider  albuterol (PROVENTIL HFA;VENTOLIN HFA) 108 (90 Base) MCG/ACT inhaler Inhale 2 puffs into the lungs every 6 (six) hours as needed for wheezing or shortness of breath. 10/10/17   Willy Eddy, MD  cyclobenzaprine (FLEXERIL) 10 MG tablet Take 1 tablet (10 mg total) by mouth 3 (three) times daily as needed for muscle spasms. 03/18/17   Nita Sickle, MD  diclofenac sodium (VOLTAREN) 1 % GEL Apply 2 g topically 4 (four) times daily. 03/18/17   Nita Sickle, MD  doxycycline (VIBRA-TABS) 100 MG tablet Take 1 tablet (100 mg total) by mouth 2 (two) times daily for 7 days. 10/10/17 10/17/17  Willy Eddy, MD  ibuprofen (ADVIL,MOTRIN) 800 MG tablet Take 1 tablet (800 mg total) by mouth every 8 (eight) hours as needed for moderate pain. 03/20/17   Irean Hong, MD  ondansetron  (ZOFRAN ODT) 4 MG disintegrating tablet Take 1 tablet (4 mg total) by mouth every 8 (eight) hours as needed for nausea or vomiting. 03/20/17   Irean Hong, MD  oxyCODONE-acetaminophen (ROXICET) 5-325 MG tablet Take 1 tablet by mouth every 4 (four) hours as needed for severe pain. 03/20/17   Irean Hong, MD  predniSONE (DELTASONE) 20 MG tablet Take 2 tablets (40 mg total) by mouth daily for 4 days. 10/10/17 10/14/17  Willy Eddy, MD    Allergies Patient has no known allergies.    Social History Social History   Tobacco Use  . Smoking status: Current Every Day Smoker  . Smokeless tobacco: Never Used  Substance Use Topics  . Alcohol use: Yes  . Drug use: No    Review of Systems Patient denies headaches, rhinorrhea, blurry vision, numbness, shortness of breath, chest pain, edema, cough, abdominal pain, nausea, vomiting, diarrhea, dysuria, fevers, rashes or hallucinations unless otherwise stated above in HPI. ____________________________________________   PHYSICAL EXAM:  VITAL SIGNS: Vitals:   10/10/17 1218  BP: (!) 166/87  Pulse: 79  Resp: 18  Temp: 98.1 F (36.7 C)  SpO2: 99%    Constitutional: Alert and oriented. Well appearing and in no acute distress. Eyes: Conjunctivae are normal.  Head: Atraumatic. Nose: No congestion/rhinnorhea. Mouth/Throat: Mucous membranes are moist.   Neck: No stridor. Painless ROM.  Cardiovascular: Normal rate, regular rhythm. Grossly normal heart sounds.  Good peripheral  circulation. Respiratory: Normal respiratory effort.  No retractions. Lungs with occasional faint wheeze Gastrointestinal: Soft and nontender. No distention. No abdominal bruits. No CVA tenderness. Genitourinary:  Musculoskeletal: No lower extremity tenderness nor edema.  No joint effusions. Neurologic:  Normal speech and language. No gross focal neurologic deficits are appreciated. No facial droop Skin:  Skin is warm, dry and intact. No rash noted. Psychiatric: Mood and  affect are normal. Speech and behavior are normal.  ____________________________________________   LABS (all labs ordered are listed, but only abnormal results are displayed)  Results for orders placed or performed during the hospital encounter of 10/10/17 (from the past 24 hour(s))  Basic metabolic panel     Status: Abnormal   Collection Time: 10/10/17 12:14 PM  Result Value Ref Range   Sodium 136 135 - 145 mmol/L   Potassium 3.7 3.5 - 5.1 mmol/L   Chloride 104 101 - 111 mmol/L   CO2 22 22 - 32 mmol/L   Glucose, Bld 126 (H) 65 - 99 mg/dL   BUN 13 6 - 20 mg/dL   Creatinine, Ser 0.98 0.61 - 1.24 mg/dL   Calcium 8.9 8.9 - 11.9 mg/dL   GFR calc non Af Amer >60 >60 mL/min   GFR calc Af Amer >60 >60 mL/min   Anion gap 10 5 - 15  CBC     Status: Abnormal   Collection Time: 10/10/17 12:14 PM  Result Value Ref Range   WBC 10.1 3.8 - 10.6 K/uL   RBC 4.59 4.40 - 5.90 MIL/uL   Hemoglobin 13.5 13.0 - 18.0 g/dL   HCT 14.7 (L) 82.9 - 56.2 %   MCV 86.9 80.0 - 100.0 fL   MCH 29.3 26.0 - 34.0 pg   MCHC 33.7 32.0 - 36.0 g/dL   RDW 13.0 86.5 - 78.4 %   Platelets 337 150 - 440 K/uL  Troponin I     Status: None   Collection Time: 10/10/17 12:14 PM  Result Value Ref Range   Troponin I <0.03 <0.03 ng/mL   ____________________________________________  EKG My review and personal interpretation at Time: 12:20   Indication: chest pain  Rate: 70  Rhythm: sinus Axis: normal Other: no stemi, normal intervals, non sepcific st changes ____________________________________________  RADIOLOGY  I personally reviewed all radiographic images ordered to evaluate for the above acute complaints and reviewed radiology reports and findings.  These findings were personally discussed with the patient.  Please see medical record for radiology report.  ____________________________________________   PROCEDURES  Procedure(s) performed:  Procedures    Critical Care performed:  no ____________________________________________   INITIAL IMPRESSION / ASSESSMENT AND PLAN / ED COURSE  Pertinent labs & imaging results that were available during my care of the patient were reviewed by me and considered in my medical decision making (see chart for details).  DDX: ACS, pericarditis, esophagitis, boerhaaves, pe, dissection, pna, bronchitis, costochondritis   Anthony Mcguire is a 57 y.o. who presents to the ED with presents with hemoptysis as described above.  Patient in no acute distress.  No respiratory distress.  Chest x-ray orchitis possible pneumonia.  Based on hemoptysis on blood thinners heavy smoker patient is at high risk for PE therefore CT angiogram ordered to further risk stratify does show evidence of probable inflammatory versus pneumonia therefore will start on doxycycline.  No evidence of PE or aneurysm or dissection.  Patient otherwise stable and appropriate for outpatient management as he has no acute hypoxia.  Discussed follow-up with PCP and signs and  symptoms for which he should return immediately to the hospital.      ____________________________________________   FINAL CLINICAL IMPRESSION(S) / ED DIAGNOSES  Final diagnoses:  Bronchitis      NEW MEDICATIONS STARTED DURING THIS VISIT:  New Prescriptions   ALBUTEROL (PROVENTIL HFA;VENTOLIN HFA) 108 (90 BASE) MCG/ACT INHALER    Inhale 2 puffs into the lungs every 6 (six) hours as needed for wheezing or shortness of breath.   DOXYCYCLINE (VIBRA-TABS) 100 MG TABLET    Take 1 tablet (100 mg total) by mouth 2 (two) times daily for 7 days.   PREDNISONE (DELTASONE) 20 MG TABLET    Take 2 tablets (40 mg total) by mouth daily for 4 days.     Note:  This document was prepared using Dragon voice recognition software and may include unintentional dictation errors.    Willy Eddyobinson, Oreoluwa Aigner, MD 10/10/17 [phone]

## 2018-12-23 IMAGING — CT CT ANGIO CHEST
2 of 6 series · 18 of 46 positions shown · IV contrast (APPLIED)
Comparison: None.

CLINICAL DATA: Coughing up blood for the past week.

EXAM:
CT ANGIOGRAPHY CHEST WITH CONTRAST
TECHNIQUE: Multidetector CT imaging of the chest was performed using the
standard protocol during bolus administration of intravenous
contrast. Multiplanar CT image reconstructions and MIPs were
obtained to evaluate the vascular anatomy.
CONTRAST:  75mL ZC6OQX-GGS IOPAMIDOL (ZC6OQX-GGS) INJECTION 76%

[Series 6: thins · axial · 0.79mm/px · z∈[-392,-100]mm · 15 of 320 slices shown]
[im 14/320  lung]
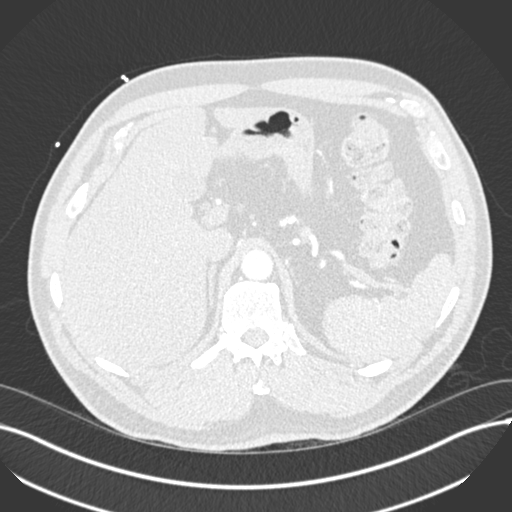
[im 42/320  soft-tissue]
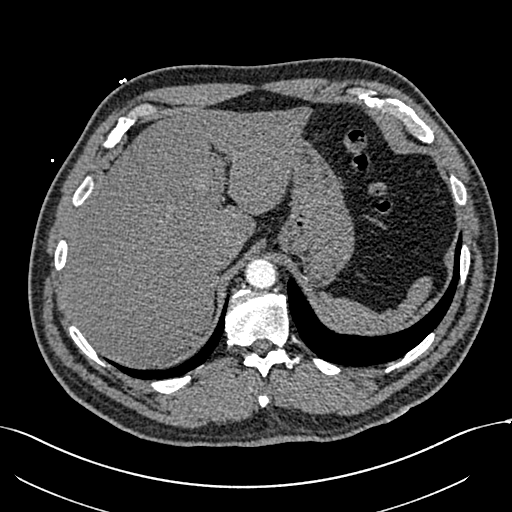
[im 56/320  lung]
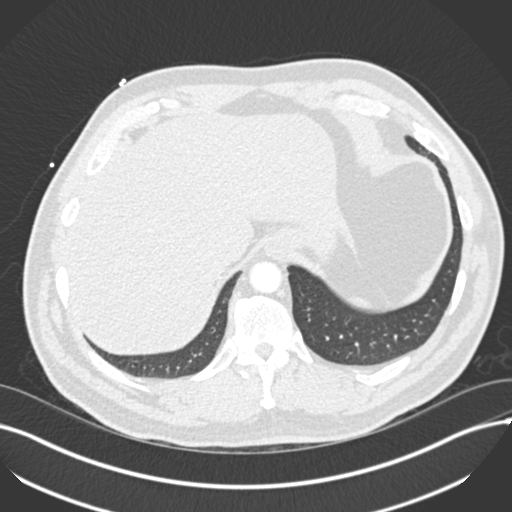
[im 84/320  soft-tissue]
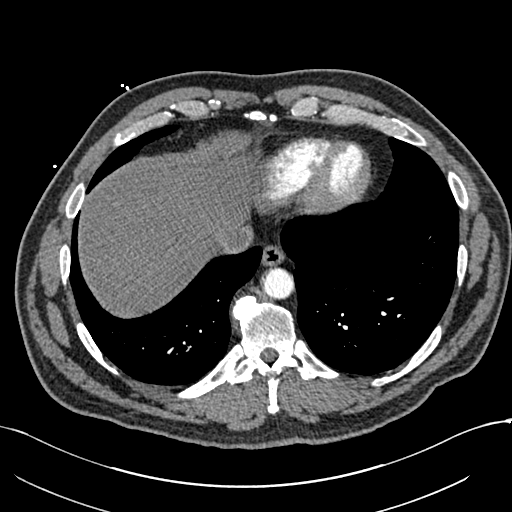
[im 98/320  lung]
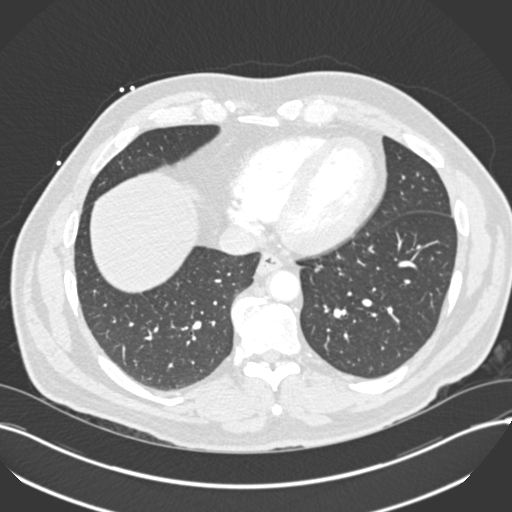
[im 125/320  soft-tissue]
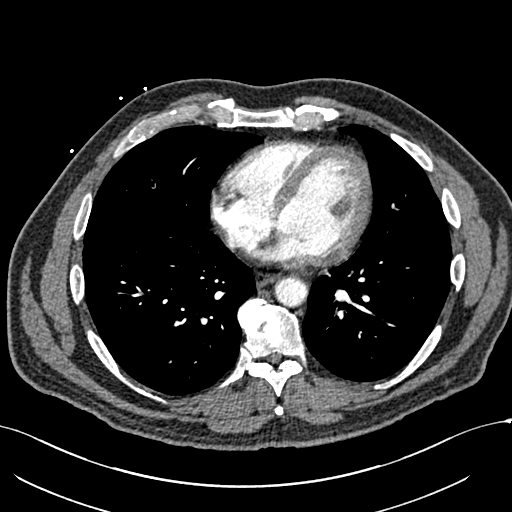
[im 139/320  lung]
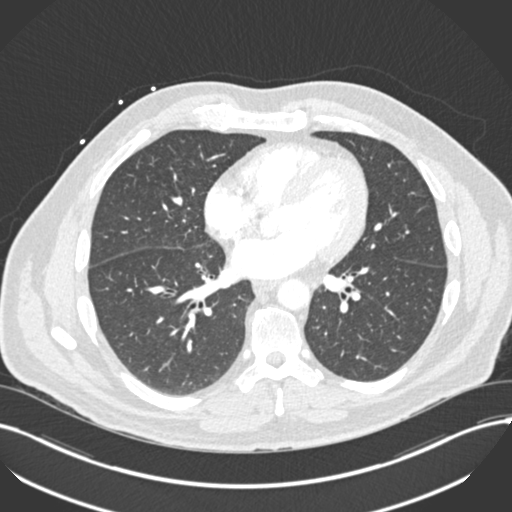
[im 167/320  soft-tissue]
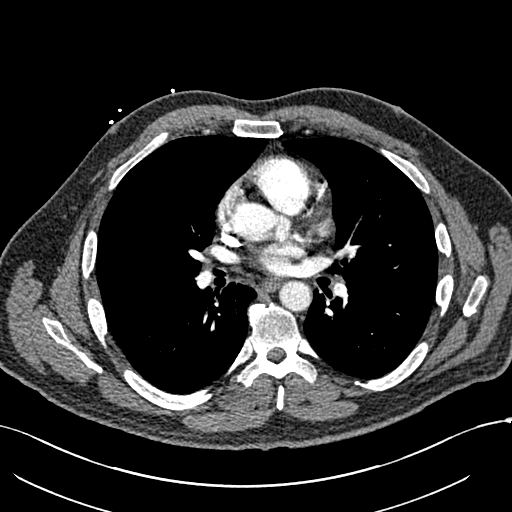
[im 181/320  lung]
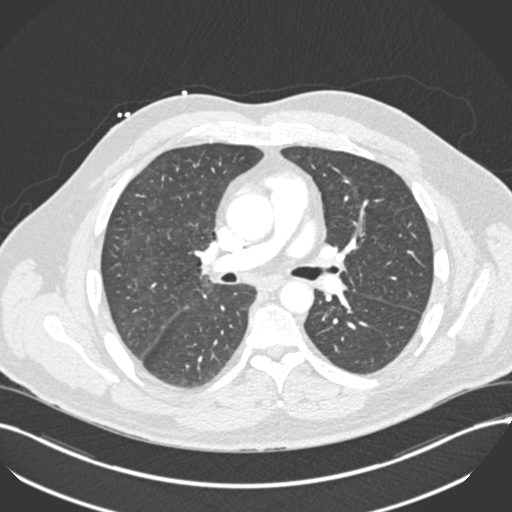
[im 195/320  soft-tissue]
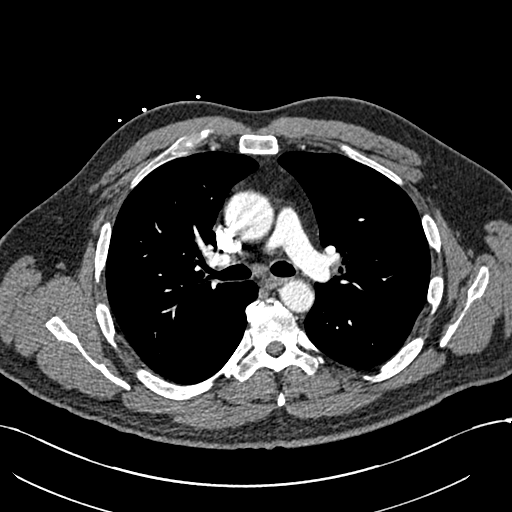
[im 222/320  lung]
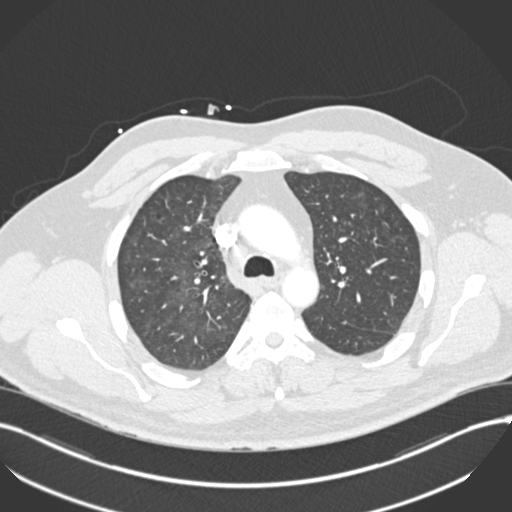
[im 236/320  soft-tissue]
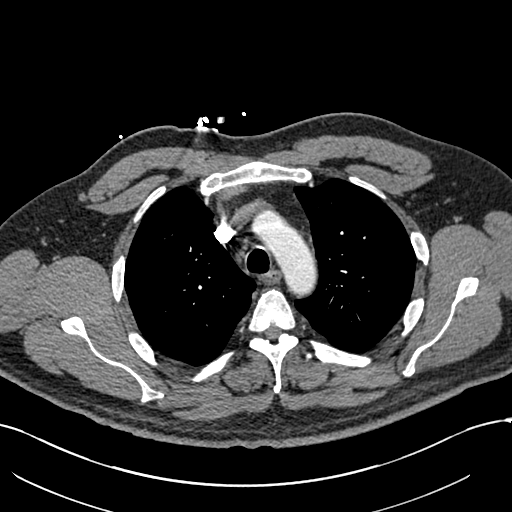
[im 264/320  lung]
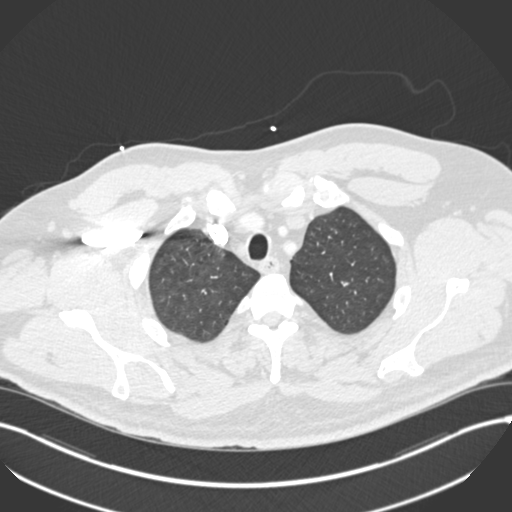
[im 278/320  soft-tissue]
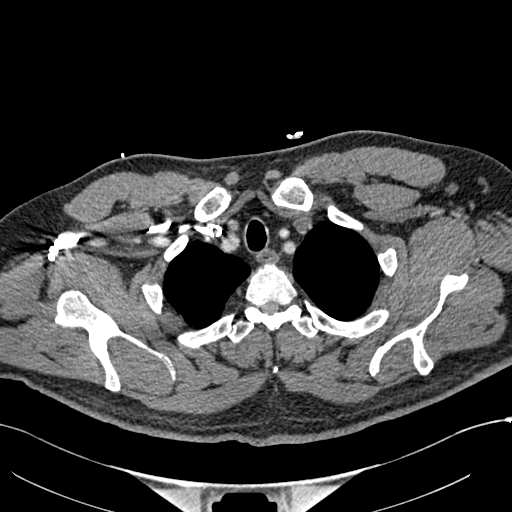
[im 306/320  lung]
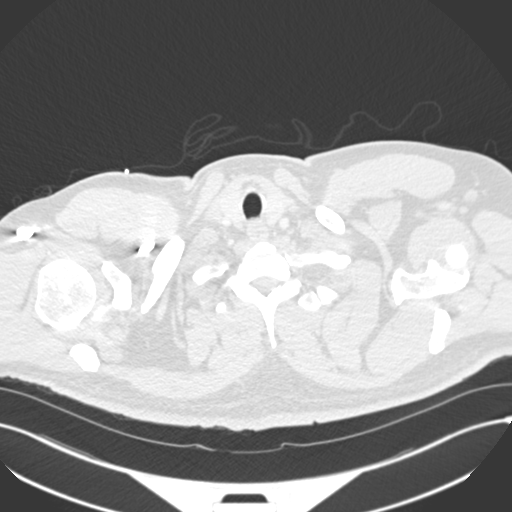

[Series 8: coronal mpr · coronal · 0.63mm/px · 3 of 106 slices shown]
[im 27/106  soft-tissue]
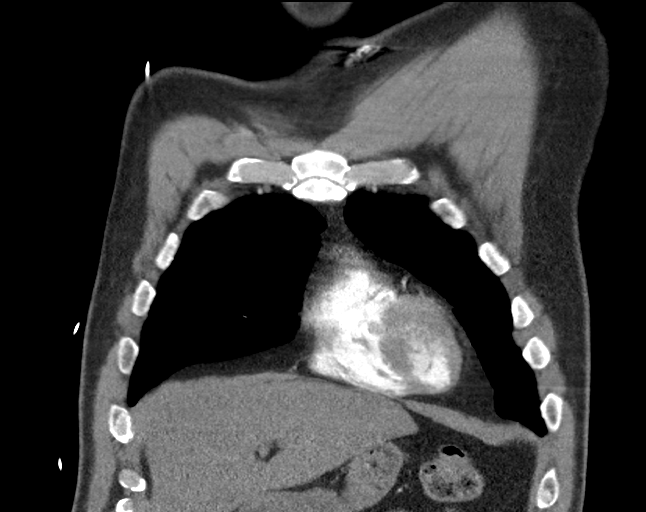
[im 53/106  soft-tissue]
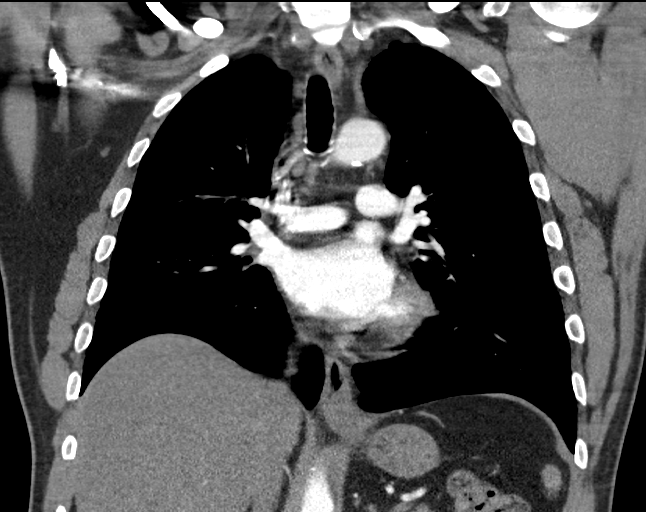
[im 79/106  soft-tissue]
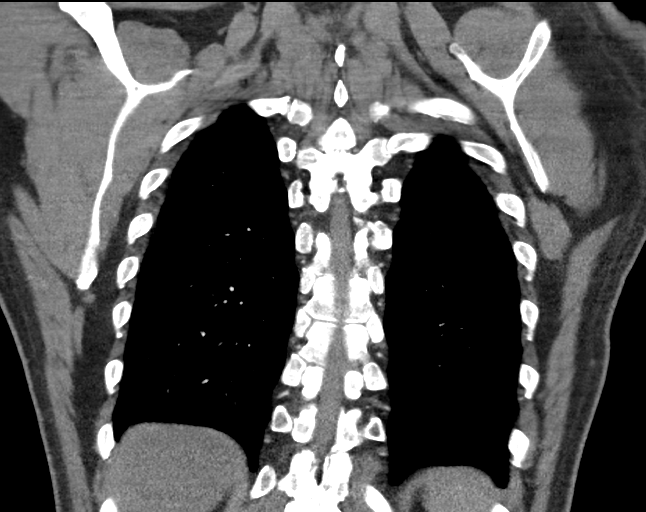

[18 of 46 positions shown; findings below may reference images not displayed]

FINDINGS: Cardiovascular: Satisfactory opacification of the pulmonary arteries
to the segmental level. No evidence of pulmonary embolism. Normal
heart size. No pericardial effusion. Normal caliber thoracic aorta.
No thoracic aortic dissection. Coronary artery atherosclerosis in
the LAD. Thoracic aortic atherosclerosis.

Mediastinum/Nodes: No enlarged mediastinal, hilar, or axillary lymph
nodes. Thyroid gland, trachea, and esophagus demonstrate no
significant findings.

Lungs/Pleura: Bilateral mild centrilobular emphysema. Patchy areas
of ground-glass opacities in the right upper lobe in ptosis of
lesser extent left upper lobe which may be secondary to an
infectious or inflammatory etiology. No pleural effusion or
pneumothorax.

Upper Abdomen: No acute upper abdominal abnormality.Low attenuation
of the liver as can be seen with hepatic steatosis.

Musculoskeletal: No acute osseous abnormality. No aggressive osseous
lesion.

Review of the MIP images confirms the above findings.
IMPRESSION: 1. No evidence pulmonary embolus.
2. No thoracic aortic aneurysm or dissection.
3. Patchy areas of ground-glass opacities in the right upper lobe in
ptosis of lesser extent left upper lobe which may be secondary to an
infectious or inflammatory etiology.
4. Aortic Atherosclerosis (MI5HT-TER.R) and Emphysema (MI5HT-Y4W.T).

## 2020-02-01 ENCOUNTER — Emergency Department: Payer: Self-pay

## 2020-02-01 ENCOUNTER — Emergency Department
Admission: EM | Admit: 2020-02-01 | Discharge: 2020-02-01 | Disposition: A | Discharge status: Home / Self Care | Payer: Self-pay | Attending: Emergency Medicine | Admitting: Emergency Medicine

## 2020-02-01 ENCOUNTER — Other Ambulatory Visit: Payer: Self-pay

## 2020-02-01 ENCOUNTER — Telehealth: Payer: Self-pay | Admitting: Emergency Medicine

## 2020-02-01 ENCOUNTER — Encounter: Payer: Self-pay | Admitting: Emergency Medicine

## 2020-02-01 DIAGNOSIS — Z5321 Procedure and treatment not carried out due to patient leaving prior to being seen by health care provider: Secondary | ICD-10-CM | POA: Insufficient documentation

## 2020-02-01 DIAGNOSIS — R079 Chest pain, unspecified: Secondary | ICD-10-CM | POA: Insufficient documentation

## 2020-02-01 DIAGNOSIS — R042 Hemoptysis: Secondary | ICD-10-CM | POA: Insufficient documentation

## 2020-02-01 LAB — COMPREHENSIVE METABOLIC PANEL
ALT: 27 U/L (ref 0–44)
AST: 21 U/L (ref 15–41)
Albumin: 4 g/dL (ref 3.5–5.0)
Alkaline Phosphatase: 52 U/L (ref 38–126)
Anion gap: 8 (ref 5–15)
BUN: 21 mg/dL — ABNORMAL HIGH (ref 6–20)
CO2: 26 mmol/L (ref 22–32)
Calcium: 8.9 mg/dL (ref 8.9–10.3)
Chloride: 103 mmol/L (ref 98–111)
Creatinine, Ser: 1.42 mg/dL — ABNORMAL HIGH (ref 0.61–1.24)
GFR calc Af Amer: >60 mL/min (ref >60)
GFR calc non Af Amer: 55 mL/min — ABNORMAL LOW (ref >60)
Glucose, Bld: 115 mg/dL — ABNORMAL HIGH (ref 70–99)
Potassium: 4.1 mmol/L (ref 3.5–5.1)
Sodium: 137 mmol/L (ref 135–145)
Total Bilirubin: 0.7 mg/dL (ref 0.3–1.2)
Total Protein: 6.9 g/dL (ref 6.5–8.1)

## 2020-02-01 LAB — CBC WITH DIFFERENTIAL/PLATELET
Abs Immature Granulocytes: 0.01 10*3/uL (ref 0.00–0.07)
Basophils Absolute: 0.1 10*3/uL (ref 0.0–0.1)
Basophils Relative: 1 %
Eosinophils Absolute: 0.2 10*3/uL (ref 0.0–0.5)
Eosinophils Relative: 2 %
HCT: 40.9 % (ref 39.0–52.0)
Hemoglobin: 13.8 g/dL (ref 13.0–17.0)
Immature Granulocytes: 0 %
Lymphocytes Relative: 40 %
Lymphs Abs: 3.4 10*3/uL (ref 0.7–4.0)
MCH: 29.3 pg (ref 26.0–34.0)
MCHC: 33.7 g/dL (ref 30.0–36.0)
MCV: 86.8 fL (ref 80.0–100.0)
Monocytes Absolute: 0.8 10*3/uL (ref 0.1–1.0)
Monocytes Relative: 9 %
Neutro Abs: 4.1 10*3/uL (ref 1.7–7.7)
Neutrophils Relative %: 48 %
Platelets: 326 10*3/uL (ref 150–400)
RBC: 4.71 MIL/uL (ref 4.22–5.81)
RDW: 12.7 % (ref 11.5–15.5)
WBC: 8.6 10*3/uL (ref 4.0–10.5)
nRBC: 0 % (ref 0.0–0.2)

## 2020-02-01 LAB — BRAIN NATRIURETIC PEPTIDE: B Natriuretic Peptide: 30 pg/mL (ref 0.0–100.0)

## 2020-02-01 LAB — TROPONIN I (HIGH SENSITIVITY): Troponin I (High Sensitivity): 9 ng/L (ref <18)

## 2020-02-01 NOTE — Telephone Encounter (Signed)
Called patient due to lwot to inquire about condition and follow up plans. Left message.   

## 2020-02-01 NOTE — ED Notes (Signed)
No answer when called several times from lobby 

## 2020-02-01 NOTE — ED Triage Notes (Signed)
Patient ambulatory to triage with steady gait, without difficulty or distress noted, mask in place; pt reports "burning" to upper chest accomp by hemoptysis tonight

## 2020-03-19 ENCOUNTER — Encounter: Payer: Self-pay | Admitting: *Deleted

## 2020-03-19 ENCOUNTER — Emergency Department: Payer: Self-pay

## 2020-03-19 ENCOUNTER — Other Ambulatory Visit: Payer: Self-pay

## 2020-03-19 ENCOUNTER — Emergency Department
Admission: EM | Admit: 2020-03-19 | Discharge: 2020-03-19 | Disposition: A | Discharge status: Home / Self Care | Payer: Self-pay | Attending: Emergency Medicine | Admitting: Emergency Medicine

## 2020-03-19 DIAGNOSIS — R079 Chest pain, unspecified: Secondary | ICD-10-CM | POA: Insufficient documentation

## 2020-03-19 DIAGNOSIS — R0602 Shortness of breath: Secondary | ICD-10-CM | POA: Insufficient documentation

## 2020-03-19 DIAGNOSIS — R42 Dizziness and giddiness: Secondary | ICD-10-CM | POA: Insufficient documentation

## 2020-03-19 DIAGNOSIS — Z5321 Procedure and treatment not carried out due to patient leaving prior to being seen by health care provider: Secondary | ICD-10-CM | POA: Insufficient documentation

## 2020-03-19 LAB — CBC
HCT: 40.7 % (ref 39.0–52.0)
Hemoglobin: 14.2 g/dL (ref 13.0–17.0)
MCH: 29.2 pg (ref 26.0–34.0)
MCHC: 34.9 g/dL (ref 30.0–36.0)
MCV: 83.7 fL (ref 80.0–100.0)
Platelets: 334 10*3/uL (ref 150–400)
RBC: 4.86 MIL/uL (ref 4.22–5.81)
RDW: 13.2 % (ref 11.5–15.5)
WBC: 8.1 10*3/uL (ref 4.0–10.5)
nRBC: 0 % (ref 0.0–0.2)

## 2020-03-19 LAB — BASIC METABOLIC PANEL
Anion gap: 13 (ref 5–15)
BUN: 18 mg/dL (ref 6–20)
CO2: 23 mmol/L (ref 22–32)
Calcium: 9 mg/dL (ref 8.9–10.3)
Chloride: 100 mmol/L (ref 98–111)
Creatinine, Ser: 1.63 mg/dL — ABNORMAL HIGH (ref 0.61–1.24)
GFR calc Af Amer: 54 mL/min — ABNORMAL LOW (ref >60)
GFR calc non Af Amer: 46 mL/min — ABNORMAL LOW (ref >60)
Glucose, Bld: 158 mg/dL — ABNORMAL HIGH (ref 70–99)
Potassium: 3.8 mmol/L (ref 3.5–5.1)
Sodium: 136 mmol/L (ref 135–145)

## 2020-03-19 LAB — TROPONIN I (HIGH SENSITIVITY): Troponin I (High Sensitivity): 10 ng/L (ref <18)

## 2020-03-19 NOTE — ED Notes (Addendum)
Pt reports he does not want to stay, states he is not having any pain right now and does not want to wait to be seen, pt states he has too much going on at home and will return if sxs worsen.   Advised patient that we usually recheck troponin level after 2 hours, but pt declined to wait.

## 2020-03-19 NOTE — ED Triage Notes (Signed)
Pt states he just woke up from a nap and had onset of burning in the middle of his chest, associated with SOB and dizziness. He took 4 baby ASA and 1 nitro with full relief from the pain. Hx of coronary stent 2013

## 2020-03-21 ENCOUNTER — Telehealth: Payer: Self-pay | Admitting: Emergency Medicine

## 2020-03-21 NOTE — Telephone Encounter (Signed)
Called patient due to lwot to inquire about condition and follow up plans. Left message.   

## 2020-03-27 ENCOUNTER — Ambulatory Visit: Payer: Self-pay | Admitting: Cardiovascular Disease

## 2020-03-27 DIAGNOSIS — I200 Unstable angina: Secondary | ICD-10-CM | POA: Insufficient documentation

## 2020-03-27 MED ORDER — SODIUM CHLORIDE 0.9% FLUSH
3.0000 mL | Freq: Two times a day (BID) | INTRAVENOUS | Status: AC
  Filled 2020-03-27: qty 3

## 2020-03-28 ENCOUNTER — Ambulatory Visit: Payer: Self-pay | Admitting: Cardiovascular Disease

## 2020-03-28 MED ORDER — SODIUM CHLORIDE 0.9% FLUSH
3.0000 mL | Freq: Two times a day (BID) | INTRAVENOUS | Status: AC
  Filled 2020-03-28: qty 3

## 2020-04-02 ENCOUNTER — Encounter
Admission: RE | Disposition: A | Discharge status: Home / Self Care | Payer: Self-pay | Source: Home / Self Care | Attending: Cardiovascular Disease

## 2020-04-02 ENCOUNTER — Ambulatory Visit: Admit: 2020-04-02 | Payer: Self-pay | Admitting: Cardiovascular Disease

## 2020-04-02 ENCOUNTER — Ambulatory Visit
Admission: RE | Admit: 2020-04-02 | Discharge: 2020-04-02 | Disposition: A | Discharge status: Home / Self Care | Payer: Self-pay | Attending: Cardiovascular Disease | Admitting: Cardiovascular Disease

## 2020-04-02 ENCOUNTER — Other Ambulatory Visit: Payer: Self-pay

## 2020-04-02 ENCOUNTER — Encounter: Payer: Self-pay | Admitting: Cardiovascular Disease

## 2020-04-02 DIAGNOSIS — Z951 Presence of aortocoronary bypass graft: Secondary | ICD-10-CM | POA: Insufficient documentation

## 2020-04-02 DIAGNOSIS — I200 Unstable angina: Secondary | ICD-10-CM | POA: Insufficient documentation

## 2020-04-02 DIAGNOSIS — F17200 Nicotine dependence, unspecified, uncomplicated: Secondary | ICD-10-CM | POA: Insufficient documentation

## 2020-04-02 DIAGNOSIS — I25110 Atherosclerotic heart disease of native coronary artery with unstable angina pectoris: Secondary | ICD-10-CM | POA: Insufficient documentation

## 2020-04-02 DIAGNOSIS — E785 Hyperlipidemia, unspecified: Secondary | ICD-10-CM | POA: Insufficient documentation

## 2020-04-02 DIAGNOSIS — Z79899 Other long term (current) drug therapy: Secondary | ICD-10-CM | POA: Insufficient documentation

## 2020-04-02 DIAGNOSIS — I10 Essential (primary) hypertension: Secondary | ICD-10-CM | POA: Insufficient documentation

## 2020-04-02 HISTORY — PX: LEFT HEART CATH AND CORONARY ANGIOGRAPHY: CATH118249

## 2020-04-02 SURGERY — LEFT HEART CATH AND CORONARY ANGIOGRAPHY
Anesthesia: Moderate Sedation | Laterality: Left

## 2020-04-02 MED ORDER — HYDRALAZINE HCL 20 MG/ML IJ SOLN: 10.0000 mg | INTRAMUSCULAR | Status: DC | PRN

## 2020-04-02 MED ORDER — SODIUM CHLORIDE 0.9 % WEIGHT BASED INFUSION: 1.0000 mL/kg/h | INTRAVENOUS | Status: DC

## 2020-04-02 MED ORDER — ACETAMINOPHEN 325 MG PO TABS: 650.0000 mg | ORAL_TABLET | ORAL | Status: DC | PRN

## 2020-04-02 MED ORDER — MIDAZOLAM HCL 2 MG/2ML IJ SOLN
INTRAMUSCULAR | Status: AC
  Filled 2020-04-02: qty 2

## 2020-04-02 MED ORDER — SODIUM CHLORIDE 0.9% FLUSH: 3.0000 mL | INTRAVENOUS | Status: DC | PRN

## 2020-04-02 MED ORDER — ONDANSETRON HCL 4 MG/2ML IJ SOLN: 4.0000 mg | Freq: Four times a day (QID) | INTRAMUSCULAR | Status: DC | PRN

## 2020-04-02 MED ORDER — SODIUM CHLORIDE 0.9 % WEIGHT BASED INFUSION
3.0000 mL/kg/h | INTRAVENOUS | Status: DC
  Administered 2020-04-02: 3 mL/kg/h via INTRAVENOUS

## 2020-04-02 MED ORDER — HEPARIN (PORCINE) IN NACL 1000-0.9 UT/500ML-% IV SOLN
INTRAVENOUS | Status: AC
  Filled 2020-04-02: qty 1000

## 2020-04-02 MED ORDER — FENTANYL CITRATE (PF) 100 MCG/2ML IJ SOLN
INTRAMUSCULAR | Status: AC
  Filled 2020-04-02: qty 2

## 2020-04-02 MED ORDER — LABETALOL HCL 5 MG/ML IV SOLN: 10.0000 mg | INTRAVENOUS | Status: DC | PRN

## 2020-04-02 MED ORDER — MIDAZOLAM HCL 2 MG/2ML IJ SOLN
INTRAMUSCULAR | Status: DC | PRN
  Administered 2020-04-02: 1 mg via INTRAVENOUS
  Administered 2020-04-02: 0.5 mg via INTRAVENOUS

## 2020-04-02 MED ORDER — ASPIRIN 81 MG PO CHEW: 81.0000 mg | CHEWABLE_TABLET | ORAL | Status: AC

## 2020-04-02 MED ORDER — SODIUM CHLORIDE 0.9 % IV SOLN: 250.0000 mL | INTRAVENOUS | Status: DC | PRN

## 2020-04-02 MED ORDER — IOHEXOL 300 MG/ML  SOLN
INTRAMUSCULAR | Status: DC | PRN
  Administered 2020-04-02: 140 mL

## 2020-04-02 MED ORDER — SODIUM CHLORIDE 0.9 % IV BOLUS
INTRAVENOUS | Status: AC | PRN
  Administered 2020-04-02: 500 mL via INTRAVENOUS

## 2020-04-02 MED ORDER — SODIUM CHLORIDE 0.9% FLUSH: 3.0000 mL | Freq: Two times a day (BID) | INTRAVENOUS | Status: DC

## 2020-04-02 MED ORDER — ASPIRIN 81 MG PO CHEW
CHEWABLE_TABLET | ORAL | Status: AC
  Administered 2020-04-02: 81 mg via ORAL
  Filled 2020-04-02: qty 1

## 2020-04-02 MED ORDER — FENTANYL CITRATE (PF) 100 MCG/2ML IJ SOLN
INTRAMUSCULAR | Status: DC | PRN
Start: 2020-04-02 — End: 2020-04-02
  Administered 2020-04-02: 25 ug via INTRAVENOUS
  Administered 2020-04-02: 50 ug via INTRAVENOUS

## 2020-04-02 MED ORDER — HEPARIN (PORCINE) IN NACL 1000-0.9 UT/500ML-% IV SOLN
INTRAVENOUS | Status: DC | PRN
  Administered 2020-04-02: 500 mL

## 2020-04-02 SURGICAL SUPPLY — 10 items
CATH INFINITI 5FR ANG PIGTAIL (CATHETERS) ×3 IMPLANT
CATH INFINITI 5FR JL4 (CATHETERS) ×3 IMPLANT
CATH INFINITI 5FR JL5 (CATHETERS) ×3 IMPLANT
CATH INFINITI JR4 5F (CATHETERS) ×3 IMPLANT
DEVICE CLOSURE MYNXGRIP 5F (Vascular Products) ×3 IMPLANT
KIT MANI 3VAL PERCEP (MISCELLANEOUS) ×3 IMPLANT
NEEDLE PERC 18GX7CM (NEEDLE) ×3 IMPLANT
PACK CARDIAC CATH (CUSTOM PROCEDURE TRAY) ×3 IMPLANT
SHEATH AVANTI 5FR X 11CM (SHEATH) ×3 IMPLANT
WIRE GUIDERIGHT .035X150 (WIRE) ×3 IMPLANT

## 2021-02-14 IMAGING — CR DG CHEST 2V
2 series · 2 of 2 positions shown · non-contrast
Comparison: None.

CLINICAL DATA: Chest pain

EXAM:
CHEST - 2 VIEW

[chest pa]
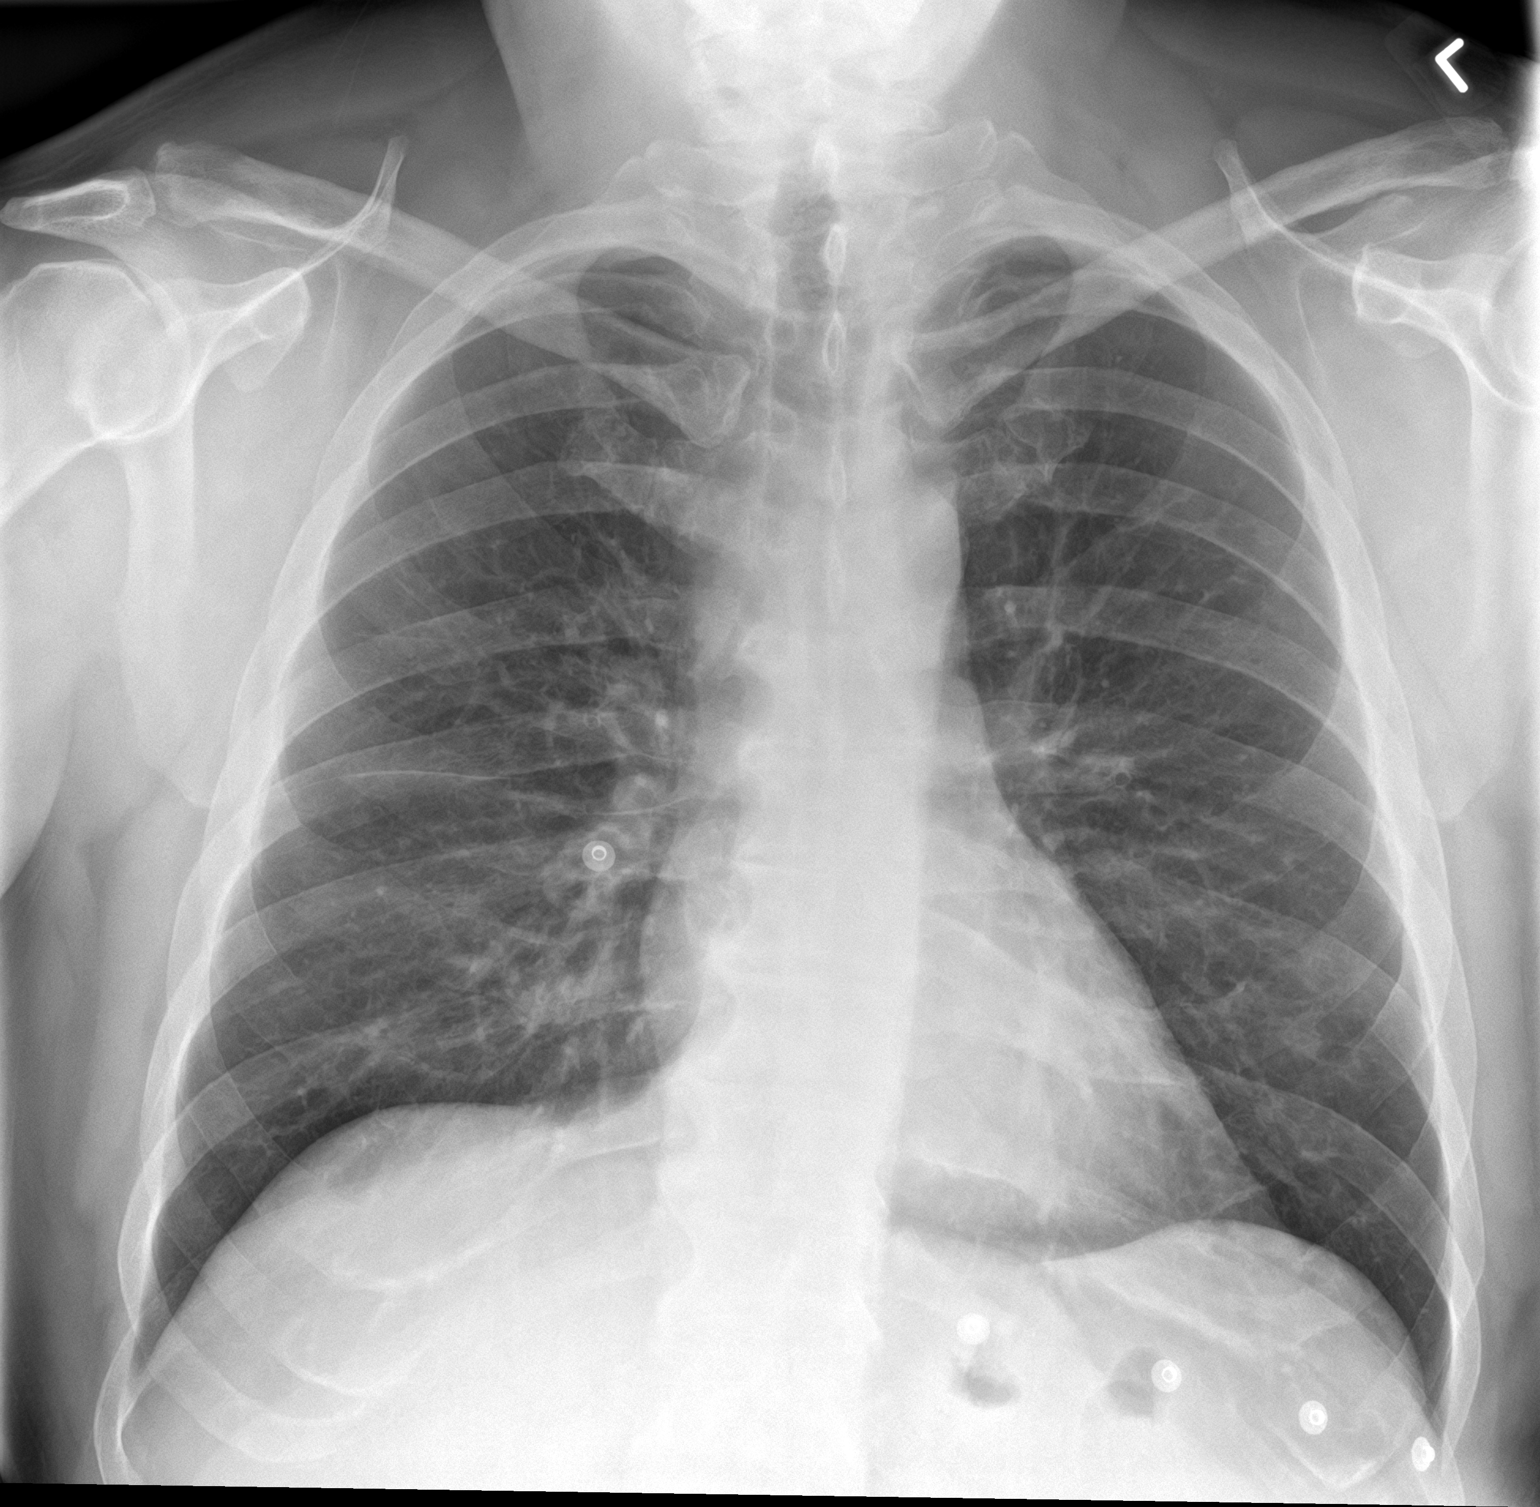

[chest lat]
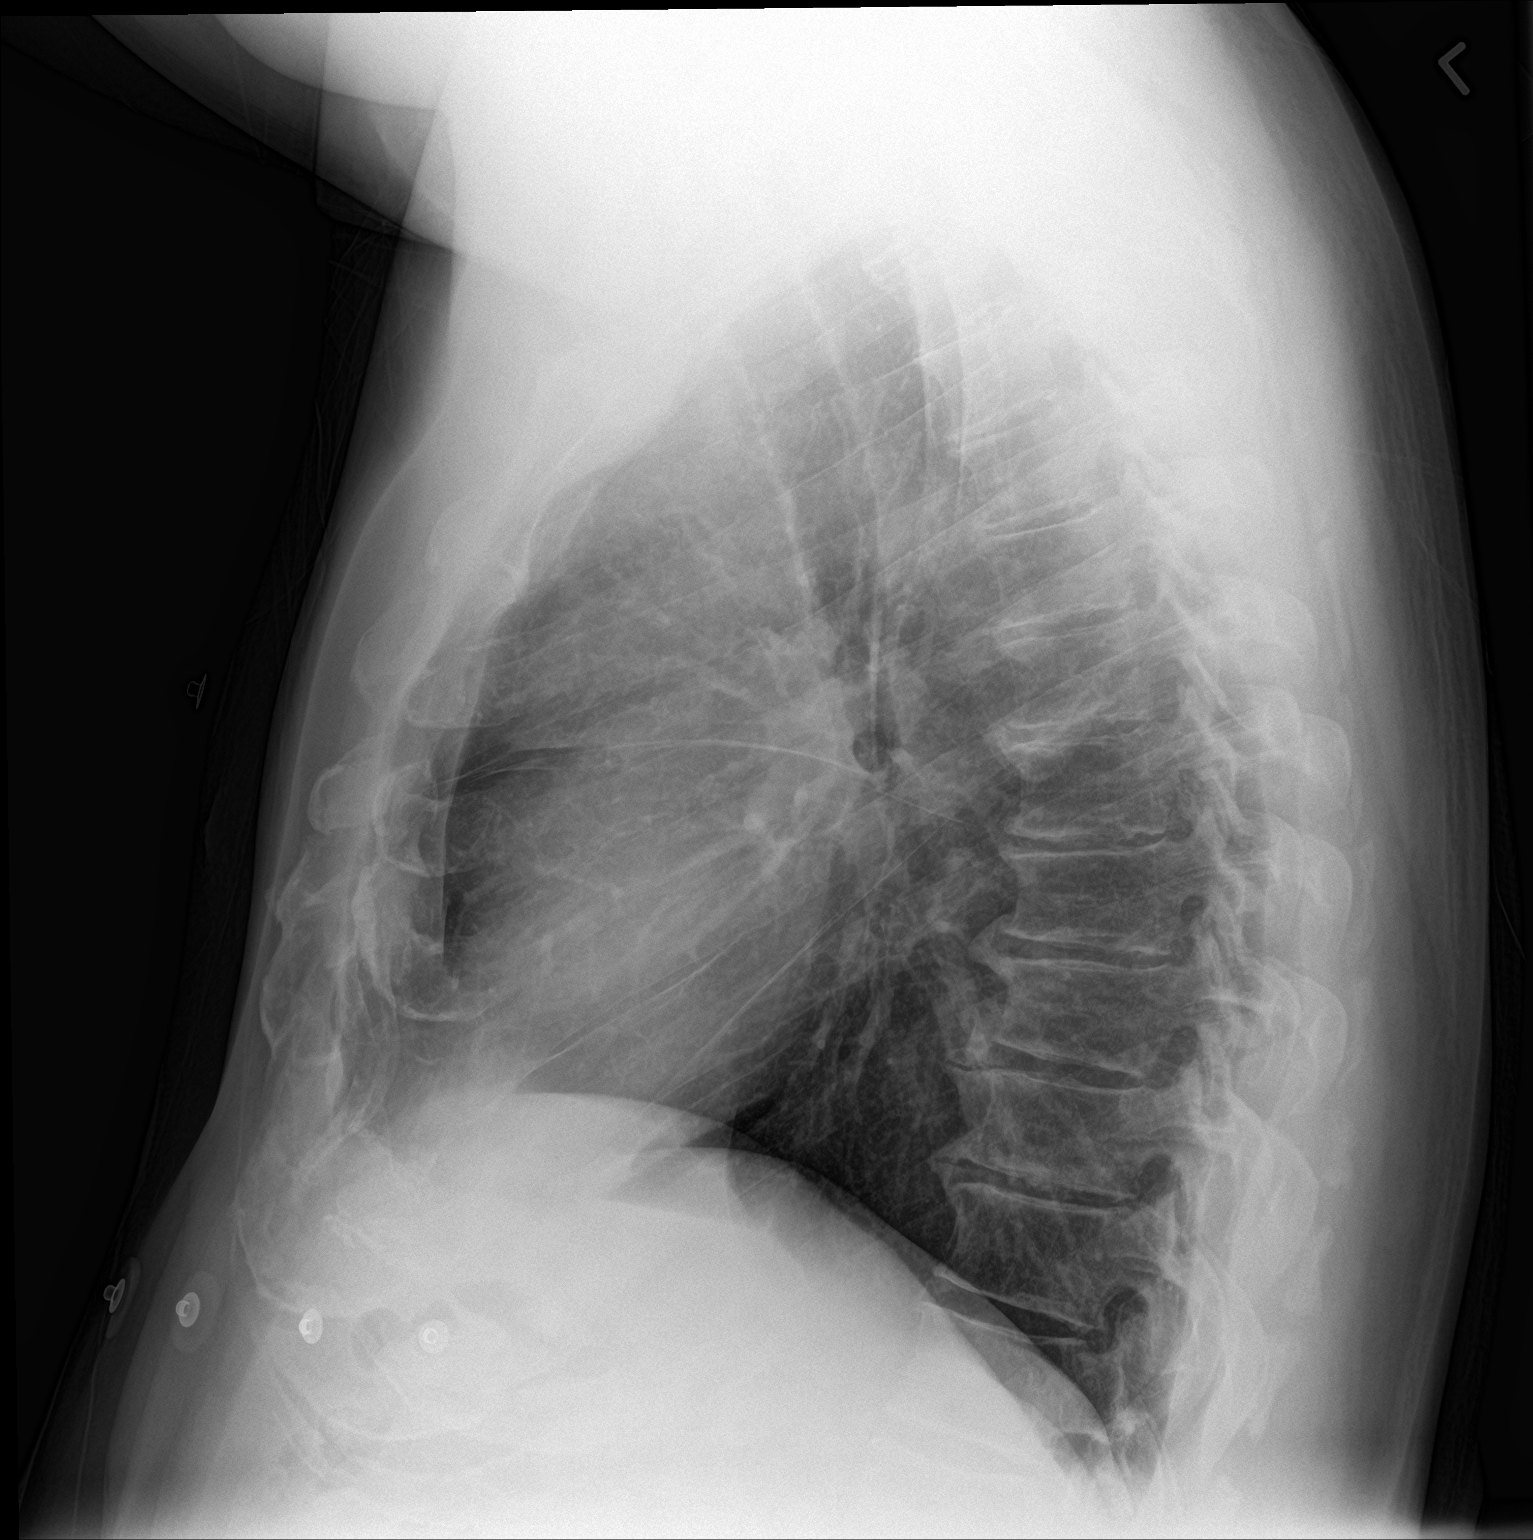

[2 of 2 positions shown; findings below may reference images not displayed]

FINDINGS: The heart size and mediastinal contours are within normal limits.
Both lungs are clear. The visualized skeletal structures are
unremarkable.
IMPRESSION: No active cardiopulmonary disease.

## 2022-08-22 ENCOUNTER — Emergency Department: Payer: Self-pay

## 2022-08-22 ENCOUNTER — Encounter: Payer: Self-pay | Admitting: Emergency Medicine

## 2022-08-22 ENCOUNTER — Other Ambulatory Visit: Payer: Self-pay

## 2022-08-22 ENCOUNTER — Inpatient Hospital Stay
Admission: EM | Admit: 2022-08-22 | Discharge: 2022-08-23 | DRG: 322 | Discharge status: Against Medical Advice | Payer: Self-pay | Attending: Osteopathic Medicine | Admitting: Osteopathic Medicine

## 2022-08-22 DIAGNOSIS — I2119 ST elevation (STEMI) myocardial infarction involving other coronary artery of inferior wall: Secondary | ICD-10-CM

## 2022-08-22 DIAGNOSIS — E876 Hypokalemia: Secondary | ICD-10-CM | POA: Diagnosis present

## 2022-08-22 DIAGNOSIS — I129 Hypertensive chronic kidney disease with stage 1 through stage 4 chronic kidney disease, or unspecified chronic kidney disease: Secondary | ICD-10-CM | POA: Diagnosis present

## 2022-08-22 DIAGNOSIS — E878 Other disorders of electrolyte and fluid balance, not elsewhere classified: Secondary | ICD-10-CM | POA: Diagnosis present

## 2022-08-22 DIAGNOSIS — Z955 Presence of coronary angioplasty implant and graft: Secondary | ICD-10-CM

## 2022-08-22 DIAGNOSIS — N182 Chronic kidney disease, stage 2 (mild): Secondary | ICD-10-CM | POA: Diagnosis present

## 2022-08-22 DIAGNOSIS — E785 Hyperlipidemia, unspecified: Secondary | ICD-10-CM

## 2022-08-22 DIAGNOSIS — E871 Hypo-osmolality and hyponatremia: Secondary | ICD-10-CM | POA: Diagnosis present

## 2022-08-22 DIAGNOSIS — Z880 Allergy status to penicillin: Secondary | ICD-10-CM

## 2022-08-22 DIAGNOSIS — I10 Essential (primary) hypertension: Secondary | ICD-10-CM

## 2022-08-22 DIAGNOSIS — Z79899 Other long term (current) drug therapy: Secondary | ICD-10-CM

## 2022-08-22 DIAGNOSIS — R042 Hemoptysis: Secondary | ICD-10-CM

## 2022-08-22 DIAGNOSIS — Z7902 Long term (current) use of antithrombotics/antiplatelets: Secondary | ICD-10-CM

## 2022-08-22 DIAGNOSIS — Z1152 Encounter for screening for COVID-19: Secondary | ICD-10-CM

## 2022-08-22 DIAGNOSIS — I252 Old myocardial infarction: Secondary | ICD-10-CM

## 2022-08-22 DIAGNOSIS — Z635 Disruption of family by separation and divorce: Secondary | ICD-10-CM

## 2022-08-22 DIAGNOSIS — I959 Hypotension, unspecified: Secondary | ICD-10-CM | POA: Diagnosis present

## 2022-08-22 DIAGNOSIS — E669 Obesity, unspecified: Secondary | ICD-10-CM | POA: Diagnosis present

## 2022-08-22 DIAGNOSIS — Z96641 Presence of right artificial hip joint: Secondary | ICD-10-CM | POA: Diagnosis present

## 2022-08-22 DIAGNOSIS — Z6830 Body mass index (BMI) 30.0-30.9, adult: Secondary | ICD-10-CM

## 2022-08-22 DIAGNOSIS — Z8249 Family history of ischemic heart disease and other diseases of the circulatory system: Secondary | ICD-10-CM

## 2022-08-22 DIAGNOSIS — F17210 Nicotine dependence, cigarettes, uncomplicated: Secondary | ICD-10-CM | POA: Diagnosis present

## 2022-08-22 DIAGNOSIS — Z7982 Long term (current) use of aspirin: Secondary | ICD-10-CM

## 2022-08-22 DIAGNOSIS — R739 Hyperglycemia, unspecified: Secondary | ICD-10-CM | POA: Diagnosis present

## 2022-08-22 DIAGNOSIS — Z87442 Personal history of urinary calculi: Secondary | ICD-10-CM

## 2022-08-22 DIAGNOSIS — I25110 Atherosclerotic heart disease of native coronary artery with unstable angina pectoris: Secondary | ICD-10-CM | POA: Diagnosis present

## 2022-08-22 DIAGNOSIS — I2111 ST elevation (STEMI) myocardial infarction involving right coronary artery: Principal | ICD-10-CM

## 2022-08-22 MED ORDER — ASPIRIN 81 MG PO CHEW
324.0000 mg | CHEWABLE_TABLET | Freq: Once | ORAL | Status: AC
  Administered 2022-08-23: 324 mg via ORAL
  Filled 2022-08-22: qty 4

## 2022-08-22 MED ORDER — NITROGLYCERIN 0.4 MG SL SUBL
0.4000 mg | SUBLINGUAL_TABLET | SUBLINGUAL | Status: DC | PRN
  Administered 2022-08-23: 0.4 mg via SUBLINGUAL
  Filled 2022-08-22: qty 1
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
## 2022-08-22 NOTE — ED Provider Notes (Signed)
[facility] Provider Note    Event Date/Time   First MD Initiated Contact with Patient 08/22/22 2344     (approximate)   History   Chest Pain and Hemoptysis   HPI  Anthony Mcguire is a 62 y.o. male here with chest pain and shortness of breath.  The patient states that his symptoms began earlier today.  He states that he has had intermittent, burning, pressure-like sensation in his chest along with shortness of breath and some mild nausea.  He states he coughed earlier today and had some blood-tinged sputum.  Interestingly, this has happened when he has had his previous MIs.  He states this does feel somewhat similar to his previous MI.  Denies any recent medical changes.  Denies any leg swelling.  He has been having this off and on for the last week but has been constant today.     Physical Exam   Triage Vital Signs: ED Triage Vitals  Enc Vitals Group     BP 08/22/22 2335 (!) 145/83     Pulse Rate 08/22/22 2335 80     Resp 08/22/22 2335 20     Temp 08/22/22 2335 98.8 F (37.1 C)     Temp Source 08/22/22 2335 Oral     SpO2 08/22/22 2335 98 %     Weight 08/22/22 2335 225 lb (102.1 kg)     Height 08/22/22 2335 5' 11.5" (1.816 m)     Head Circumference --      Peak Flow --      Pain Score 08/22/22 2334 6     Pain Loc --      Pain Edu? --      Excl. in [place]? --     Most recent vital signs: Vitals:   08/23/22 0253 08/23/22 0300  BP: 104/68 96/69  Pulse: 63 66  Resp: 18 (!) 21  Temp: 97.8 F (36.6 C)   SpO2: 99% 99%     General: Awake, no distress.  CV:  Good peripheral perfusion.  Regular rate and rhythm.  No murmurs. Resp:  Normal effort.  Lungs clear auscultation bilaterally. Abd:  No distention.  Other:  Moist mucous membranes.  No lower extremity edema.   ED Results / Procedures / Treatments   Labs (all labs ordered are listed, but only abnormal results are displayed) Labs Reviewed  CBC - Abnormal; Notable for the following  components:      Result Value   WBC 15.4 (*)    All other components within normal limits  COMPREHENSIVE METABOLIC PANEL - Abnormal; Notable for the following components:   Sodium 129 (*)    Potassium 3.2 (*)    Chloride 93 (*)    Glucose, Bld 296 (*)    Creatinine, Ser 1.62 (*)    AST 46 (*)    GFR, Estimated 49 (*)    All other components within normal limits  GLUCOSE, CAPILLARY - Abnormal; Notable for the following components:   Glucose-Capillary 249 (*)    All other components within normal limits  TROPONIN I (HIGH SENSITIVITY) - Abnormal; Notable for the following components:   Troponin I (High Sensitivity) 6,098 (*)    All other components within normal limits  TROPONIN I (HIGH SENSITIVITY) - Abnormal; Notable for the following components:   Troponin I (High Sensitivity) 10,509 (*)    All other components within normal limits  RESP PANEL BY RT-PCR (RSV, FLU A&B, COVID)  RVPGX2  BASIC METABOLIC PANEL  CBC  LIPOPROTEIN A (LPA)  PROTIME-INR  APTT  POCT ACTIVATED CLOTTING TIME  TYPE AND SCREEN     EKG Normal sinus rhythm, ventricular rate 64.  PR 174, QRS 1 2, QTc 431.  ST flattening and slight elevation in 3, flattening in aVF but does not quite meet STEMI criteria.  Initially never cervicals although there is questionable subtle lateral changes on repeat EKGs.   RADIOLOGY Chest x-ray: Clear  I also independently reviewed and agree with radiologist interpretations.   PROCEDURES:  Critical Care performed: Yes, see critical care procedure note(s)  .Critical Care  Performed by: Shaune Pollack, MD Authorized by: Shaune Pollack, MD   Critical care provider statement:    Critical care time (minutes):  30   Critical care time was exclusive of:  Separately billable procedures and treating other patients   Critical care was necessary to treat or prevent imminent or life-threatening deterioration of the following conditions:  Cardiac failure, circulatory failure and  respiratory failure   Critical care was time spent personally by me on the following activities:  Development of treatment plan with patient or surrogate, discussions with consultants, evaluation of patient's response to treatment, examination of patient, ordering and review of laboratory studies, ordering and review of radiographic studies, ordering and performing treatments and interventions, pulse oximetry, re-evaluation of patient's condition and review of old charts     MEDICATIONS ORDERED IN ED: Medications  nitroGLYCERIN (NITROSTAT) SL tablet 0.4 mg ( Sublingual MAR Unhold 08/23/22 0241)  0.9 %  sodium chloride infusion (0 mLs Intravenous Stopped 08/23/22 0142)  aspirin EC tablet 81 mg (has no administration in time range)  metoprolol succinate (TOPROL-XL) 24 hr tablet 50 mg (has no administration in time range)  clopidogrel (PLAVIX) tablet 75 mg (has no administration in time range)  rosuvastatin (CRESTOR) tablet 40 mg (has no administration in time range)  acetaminophen (TYLENOL) tablet 650 mg (has no administration in time range)  ondansetron (ZOFRAN) injection 4 mg (has no administration in time range)  0.9 %  sodium chloride infusion ( Intravenous New Bag/Given 08/23/22 0325)  sodium chloride flush (NS) 0.9 % injection 3 mL (has no administration in time range)  sodium chloride flush (NS) 0.9 % injection 3 mL (has no administration in time range)  losartan (COZAAR) tablet 25 mg (has no administration in time range)  potassium chloride SA (KLOR-CON M) CR tablet 40 mEq (has no administration in time range)  albuterol (PROVENTIL) (2.5 MG/3ML) 0.083% nebulizer solution 2.5 mg (has no administration in time range)  aspirin chewable tablet 324 mg (324 mg Oral Given 08/23/22 0004)  heparin injection 4,000 Units (4,000 Units Intravenous Given 08/23/22 0104)  morphine (PF) 4 MG/ML injection 4 mg (4 mg Intravenous Given 08/23/22 0113)  ondansetron (ZOFRAN-ODT) disintegrating tablet 4 mg (4 mg  Oral Given 08/23/22 0113)  sodium chloride 0.9 % bolus 500 mL (500 mLs Intravenous New Bag/Given 08/23/22 0252)     IMPRESSION / MDM / ASSESSMENT AND PLAN / ED COURSE  I reviewed the triage vital signs and the nursing notes.                              Differential diagnosis includes, but is not limited to, ACS, NSTEMI, PNA, PTX, PE, gerd/gastritis, PUD.  Patient's presentation is most consistent with acute presentation with potential threat to life or bodily function.  The patient is on the cardiac monitor to evaluate for evidence of arrhythmia and/or significant heart  rate changes.  62 year old male here with chest pain.  Initial EKG with ST flattening in the inferior leads, though no overt reciprocal changes.  Does not quite meet STEMI criteria.  Initial troponin elevated at >6000.  Repeat EKG again did not meet STEMI criteria but I called and immediately discussed with Dr. Cicero Duck of cardiology given concerning findings and history.  Decision was subsequently made to activate the Cath Lab to take for emergent catheterization.  Repeat EKG shows persistent ST elevation in 3 and flattening in aVF, with possible early reciprocal changes in lateral leads.  Patient given aspirin, heparin.  He is hemodynamically stable.    FINAL CLINICAL IMPRESSION(S) / ED DIAGNOSES   Final diagnoses:  ST elevation myocardial infarction involving right coronary artery [facility])     Rx / DC Orders   ED Discharge Orders          Ordered    AMB Referral to Cardiac Rehabilitation - Phase II        08/23/22 0233             Note:  This document was prepared using Dragon voice recognition software and may include unintentional dictation errors.   Duffy Bruce, MD 08/23/22 [phone]

## 2022-08-23 ENCOUNTER — Encounter
Admission: EM | Discharge status: Against Medical Advice | Payer: Self-pay | Source: Home / Self Care | Attending: Osteopathic Medicine

## 2022-08-23 ENCOUNTER — Inpatient Hospital Stay
Admit: 2022-08-23 | Discharge: 2022-08-23 | Disposition: A | Discharge status: Home / Self Care | Payer: Self-pay | Attending: Cardiovascular Disease | Admitting: Cardiovascular Disease

## 2022-08-23 DIAGNOSIS — I213 ST elevation (STEMI) myocardial infarction of unspecified site: Secondary | ICD-10-CM

## 2022-08-23 DIAGNOSIS — I10 Essential (primary) hypertension: Secondary | ICD-10-CM

## 2022-08-23 DIAGNOSIS — I2510 Atherosclerotic heart disease of native coronary artery without angina pectoris: Secondary | ICD-10-CM

## 2022-08-23 DIAGNOSIS — I2119 ST elevation (STEMI) myocardial infarction involving other coronary artery of inferior wall: Secondary | ICD-10-CM

## 2022-08-23 DIAGNOSIS — I2111 ST elevation (STEMI) myocardial infarction involving right coronary artery: Principal | ICD-10-CM

## 2022-08-23 DIAGNOSIS — E785 Hyperlipidemia, unspecified: Secondary | ICD-10-CM

## 2022-08-23 DIAGNOSIS — R042 Hemoptysis: Secondary | ICD-10-CM

## 2022-08-23 HISTORY — PX: CORONARY/GRAFT ACUTE MI REVASCULARIZATION: CATH118305

## 2022-08-23 HISTORY — PX: LEFT HEART CATH AND CORONARY ANGIOGRAPHY: CATH118249

## 2022-08-23 LAB — ECHOCARDIOGRAM COMPLETE
AR max vel: 2.67 cm2
AV Area VTI: 2.55 cm2
AV Area mean vel: 2.41 cm2
AV Mean grad: 2 mmHg
AV Peak grad: 4.2 mmHg
Ao pk vel: 1.03 m/s
Area-P 1/2: 3.6 cm2
Calc EF: 68.5 %
Height: 71.5 in
S' Lateral: 3.4 cm
Single Plane A2C EF: 64.8 %
Single Plane A4C EF: 71 %
Weight: 3499.14 oz

## 2022-08-23 LAB — TROPONIN I (HIGH SENSITIVITY)
Troponin I (High Sensitivity): 10509 ng/L (ref <18)
Troponin I (High Sensitivity): 6098 ng/L (ref <18)
Troponin I (High Sensitivity): >24000 ng/L (ref <18)

## 2022-08-23 LAB — GLUCOSE, CAPILLARY
Glucose-Capillary: 249 mg/dL — ABNORMAL HIGH (ref 70–99)
Glucose-Capillary: 208 mg/dL — ABNORMAL HIGH (ref 70–99)

## 2022-08-23 LAB — CBC
HCT: 39.9 % (ref 39.0–52.0)
HCT: 42.6 % (ref 39.0–52.0)
Hemoglobin: 13.2 g/dL (ref 13.0–17.0)
Hemoglobin: 14.7 g/dL (ref 13.0–17.0)
MCH: 28.1 pg (ref 26.0–34.0)
MCH: 28.2 pg (ref 26.0–34.0)
MCHC: 33.1 g/dL (ref 30.0–36.0)
MCHC: 34.5 g/dL (ref 30.0–36.0)
MCV: 81.6 fL (ref 80.0–100.0)
MCV: 85.1 fL (ref 80.0–100.0)
Platelets: 240 10*3/uL (ref 150–400)
Platelets: 291 10*3/uL (ref 150–400)
RBC: 4.69 MIL/uL (ref 4.22–5.81)
RBC: 5.22 MIL/uL (ref 4.22–5.81)
RDW: 13.2 % (ref 11.5–15.5)
RDW: 13.4 % (ref 11.5–15.5)
WBC: 11.9 10*3/uL — ABNORMAL HIGH (ref 4.0–10.5)
WBC: 15.4 10*3/uL — ABNORMAL HIGH (ref 4.0–10.5)
nRBC: 0 % (ref 0.0–0.2)
nRBC: 0 % (ref 0.0–0.2)

## 2022-08-23 LAB — COMPREHENSIVE METABOLIC PANEL
ALT: 31 U/L (ref 0–44)
AST: 46 U/L — ABNORMAL HIGH (ref 15–41)
Albumin: 3.9 g/dL (ref 3.5–5.0)
Alkaline Phosphatase: 60 U/L (ref 38–126)
Anion gap: 13 (ref 5–15)
BUN: 19 mg/dL (ref 6–20)
CO2: 23 mmol/L (ref 22–32)
Calcium: 9.5 mg/dL (ref 8.9–10.3)
Chloride: 93 mmol/L — ABNORMAL LOW (ref 98–111)
Creatinine, Ser: 1.62 mg/dL — ABNORMAL HIGH (ref 0.61–1.24)
GFR, Estimated: 49 mL/min — ABNORMAL LOW (ref >60)
Glucose, Bld: 296 mg/dL — ABNORMAL HIGH (ref 70–99)
Potassium: 3.2 mmol/L — ABNORMAL LOW (ref 3.5–5.1)
Sodium: 129 mmol/L — ABNORMAL LOW (ref 135–145)
Total Bilirubin: 1 mg/dL (ref 0.3–1.2)
Total Protein: 7.4 g/dL (ref 6.5–8.1)

## 2022-08-23 LAB — PROTIME-INR
INR: 1 (ref 0.8–1.2)
Prothrombin Time: 13.3 seconds (ref 11.4–15.2)

## 2022-08-23 LAB — RESP PANEL BY RT-PCR (RSV, FLU A&B, COVID)  RVPGX2
Influenza A by PCR: NEGATIVE
Influenza B by PCR: NEGATIVE
Resp Syncytial Virus by PCR: NEGATIVE
SARS Coronavirus 2 by RT PCR: NEGATIVE

## 2022-08-23 LAB — BASIC METABOLIC PANEL
Anion gap: 9 (ref 5–15)
BUN: 16 mg/dL (ref 6–20)
CO2: 21 mmol/L — ABNORMAL LOW (ref 22–32)
Calcium: 8.6 mg/dL — ABNORMAL LOW (ref 8.9–10.3)
Chloride: 100 mmol/L (ref 98–111)
Creatinine, Ser: 1.31 mg/dL — ABNORMAL HIGH (ref 0.61–1.24)
GFR, Estimated: >60 mL/min (ref >60)
Glucose, Bld: 194 mg/dL — ABNORMAL HIGH (ref 70–99)
Potassium: 3.8 mmol/L (ref 3.5–5.1)
Sodium: 130 mmol/L — ABNORMAL LOW (ref 135–145)

## 2022-08-23 LAB — APTT: aPTT: 46 seconds — ABNORMAL HIGH (ref 24–36)

## 2022-08-23 LAB — HEMOGLOBIN AND HEMATOCRIT, BLOOD
HCT: 40 % (ref 39.0–52.0)
HCT: 37 % — ABNORMAL LOW (ref 39.0–52.0)
HCT: 37.6 % — ABNORMAL LOW (ref 39.0–52.0)
Hemoglobin: 13.5 g/dL (ref 13.0–17.0)
Hemoglobin: 12.7 g/dL — ABNORMAL LOW (ref 13.0–17.0)
Hemoglobin: 12.6 g/dL — ABNORMAL LOW (ref 13.0–17.0)

## 2022-08-23 LAB — ABO/RH: ABO/RH(D): A POS

## 2022-08-23 LAB — POCT ACTIVATED CLOTTING TIME: Activated Clotting Time: 396 seconds

## 2022-08-23 SURGERY — CORONARY/GRAFT ACUTE MI REVASCULARIZATION
Anesthesia: Moderate Sedation

## 2022-08-23 MED ORDER — VERAPAMIL HCL 2.5 MG/ML IV SOLN
INTRAVENOUS | Status: DC | PRN
  Administered 2022-08-23: 2.5 mg via INTRA_ARTERIAL

## 2022-08-23 MED ORDER — NITROGLYCERIN IN D5W 200-5 MCG/ML-% IV SOLN
INTRAVENOUS | Status: AC
  Filled 2022-08-23: qty 250

## 2022-08-23 MED ORDER — HEPARIN SODIUM (PORCINE) 1000 UNIT/ML IJ SOLN
INTRAMUSCULAR | Status: DC | PRN
  Administered 2022-08-23: 6000 [IU] via INTRAVENOUS

## 2022-08-23 MED ORDER — HYDROCOD POLI-CHLORPHE POLI ER 10-8 MG/5ML PO SUER: 5.0000 mL | Freq: Two times a day (BID) | ORAL | Status: DC | PRN

## 2022-08-23 MED ORDER — ASPIRIN 81 MG PO TBEC
81.0000 mg | DELAYED_RELEASE_TABLET | Freq: Every day | ORAL | Status: DC
  Administered 2022-08-23: 81 mg via ORAL
  Filled 2022-08-23: qty 1

## 2022-08-23 MED ORDER — CLOPIDOGREL BISULFATE 75 MG PO TABS
ORAL_TABLET | ORAL | Status: AC
  Filled 2022-08-23: qty 8

## 2022-08-23 MED ORDER — CLOPIDOGREL BISULFATE 75 MG PO TABS
75.0000 mg | ORAL_TABLET | Freq: Every day | ORAL | Status: DC
  Administered 2022-08-23: 75 mg via ORAL
  Filled 2022-08-23: qty 1

## 2022-08-23 MED ORDER — HEPARIN (PORCINE) IN NACL 1000-0.9 UT/500ML-% IV SOLN
INTRAVENOUS | Status: AC
  Filled 2022-08-23: qty 1000

## 2022-08-23 MED ORDER — NITROGLYCERIN 1 MG/10 ML FOR IR/CATH LAB
INTRA_ARTERIAL | Status: AC
  Filled 2022-08-23: qty 10

## 2022-08-23 MED ORDER — NITROGLYCERIN 2 % TD OINT
1.0000 [in_us] | TOPICAL_OINTMENT | Freq: Three times a day (TID) | TRANSDERMAL | Status: DC
  Administered 2022-08-23 (×2): 1 [in_us] via TOPICAL
  Filled 2022-08-23 (×2): qty 1

## 2022-08-23 MED ORDER — SODIUM CHLORIDE 0.9 % IV BOLUS
500.0000 mL | Freq: Once | INTRAVENOUS | Status: AC
  Administered 2022-08-23: 500 mL via INTRAVENOUS

## 2022-08-23 MED ORDER — VERAPAMIL HCL 2.5 MG/ML IV SOLN
INTRAVENOUS | Status: AC
  Filled 2022-08-23: qty 2

## 2022-08-23 MED ORDER — ACETAMINOPHEN 325 MG PO TABS: 650.0000 mg | ORAL_TABLET | ORAL | Status: DC | PRN

## 2022-08-23 MED ORDER — ALBUTEROL SULFATE (2.5 MG/3ML) 0.083% IN NEBU: 2.5000 mg | INHALATION_SOLUTION | Freq: Four times a day (QID) | RESPIRATORY_TRACT | Status: DC | PRN

## 2022-08-23 MED ORDER — POTASSIUM CHLORIDE CRYS ER 20 MEQ PO TBCR
40.0000 meq | EXTENDED_RELEASE_TABLET | Freq: Once | ORAL | Status: AC
  Administered 2022-08-23: 40 meq via ORAL
  Filled 2022-08-23: qty 2

## 2022-08-23 MED ORDER — SODIUM CHLORIDE 0.9% FLUSH
3.0000 mL | Freq: Two times a day (BID) | INTRAVENOUS | Status: DC
  Administered 2022-08-23 (×2): 3 mL via INTRAVENOUS

## 2022-08-23 MED ORDER — ROSUVASTATIN CALCIUM 10 MG PO TABS
40.0000 mg | ORAL_TABLET | Freq: Every day | ORAL | Status: DC
  Administered 2022-08-23: 40 mg via ORAL
  Filled 2022-08-23: qty 4

## 2022-08-23 MED ORDER — HEPARIN SODIUM (PORCINE) 1000 UNIT/ML IJ SOLN
INTRAMUSCULAR | Status: AC
  Filled 2022-08-23: qty 10

## 2022-08-23 MED ORDER — HEPARIN (PORCINE) 25000 UT/250ML-% IV SOLN
1250.0000 [IU]/h | INTRAVENOUS | Status: DC
  Administered 2022-08-23: 1250 [IU]/h via INTRAVENOUS
  Filled 2022-08-23: qty 250

## 2022-08-23 MED ORDER — NITROGLYCERIN 1 MG/10 ML FOR IR/CATH LAB
INTRA_ARTERIAL | Status: DC | PRN
  Administered 2022-08-23: 100 ug via INTRACORONARY

## 2022-08-23 MED ORDER — CLOPIDOGREL BISULFATE 75 MG PO TABS
ORAL_TABLET | ORAL | Status: DC | PRN
  Administered 2022-08-23: 600 mg via ORAL

## 2022-08-23 MED ORDER — HEPARIN (PORCINE) IN NACL 2000-0.9 UNIT/L-% IV SOLN
INTRAVENOUS | Status: DC | PRN
  Administered 2022-08-23: 1000 mL

## 2022-08-23 MED ORDER — ONDANSETRON 4 MG PO TBDP
4.0000 mg | ORAL_TABLET | Freq: Once | ORAL | Status: AC
  Administered 2022-08-23: 4 mg via ORAL
  Filled 2022-08-23: qty 1

## 2022-08-23 MED ORDER — LOSARTAN POTASSIUM 25 MG PO TABS
25.0000 mg | ORAL_TABLET | Freq: Every day | ORAL | Status: DC
  Administered 2022-08-23: 25 mg via ORAL
  Filled 2022-08-23: qty 1

## 2022-08-23 MED ORDER — ALBUTEROL SULFATE HFA 108 (90 BASE) MCG/ACT IN AERS: 2.0000 | INHALATION_SPRAY | Freq: Four times a day (QID) | RESPIRATORY_TRACT | Status: DC | PRN

## 2022-08-23 MED ORDER — SODIUM CHLORIDE 0.9% FLUSH: 3.0000 mL | INTRAVENOUS | Status: DC | PRN

## 2022-08-23 MED ORDER — PERFLUTREN LIPID MICROSPHERE
1.0000 mL | INTRAVENOUS | Status: AC | PRN
  Administered 2022-08-23: 3 mL via INTRAVENOUS

## 2022-08-23 MED ORDER — ONDANSETRON HCL 4 MG/2ML IJ SOLN: 4.0000 mg | Freq: Four times a day (QID) | INTRAMUSCULAR | Status: DC | PRN

## 2022-08-23 MED ORDER — METOPROLOL SUCCINATE ER 50 MG PO TB24
50.0000 mg | ORAL_TABLET | Freq: Every day | ORAL | Status: DC
  Administered 2022-08-23: 50 mg via ORAL
  Filled 2022-08-23: qty 1

## 2022-08-23 MED ORDER — HEPARIN SODIUM (PORCINE) 5000 UNIT/ML IJ SOLN
4000.0000 [IU] | Freq: Once | INTRAMUSCULAR | Status: AC
  Administered 2022-08-23: 4000 [IU] via INTRAVENOUS
  Filled 2022-08-23: qty 1

## 2022-08-23 MED ORDER — SODIUM CHLORIDE 0.9 % IV SOLN: 250.0000 mL | INTRAVENOUS | Status: DC | PRN

## 2022-08-23 MED ORDER — GUAIFENESIN ER 600 MG PO TB12
600.0000 mg | ORAL_TABLET | Freq: Two times a day (BID) | ORAL | Status: DC
  Administered 2022-08-23 (×2): 600 mg via ORAL
  Filled 2022-08-23 (×2): qty 1

## 2022-08-23 MED ORDER — SODIUM CHLORIDE 0.9 % IV SOLN: INTRAVENOUS | Status: DC

## 2022-08-23 MED ORDER — SODIUM CHLORIDE 0.9 % IV SOLN: INTRAVENOUS | Status: AC

## 2022-08-23 MED ORDER — MORPHINE SULFATE (PF) 4 MG/ML IV SOLN
4.0000 mg | Freq: Once | INTRAVENOUS | Status: AC
  Administered 2022-08-23: 4 mg via INTRAVENOUS
  Filled 2022-08-23: qty 1

## 2022-08-23 MED ORDER — SODIUM CHLORIDE 0.9 % IV BOLUS
250.0000 mL | Freq: Once | INTRAVENOUS | Status: AC
  Administered 2022-08-23: 250 mL via INTRAVENOUS

## 2022-08-23 MED ORDER — LIDOCAINE HCL 1 % IJ SOLN
INTRAMUSCULAR | Status: AC
  Filled 2022-08-23: qty 20

## 2022-08-23 SURGICAL SUPPLY — 18 items
BALLN TREK RX 2.5X12 (BALLOONS) ×1
BALLN ~~LOC~~ TREK NEO RX 3.25X12 (BALLOONS) IMPLANT
BALLOON TREK RX 2.5X12 (BALLOONS) IMPLANT
CATH INFINITI 5FR JK (CATHETERS) IMPLANT
CATH VISTA GUIDE 6FR JR4 (CATHETERS) IMPLANT
DEVICE RAD TR BAND REGULAR (VASCULAR PRODUCTS) IMPLANT
DRAPE BRACHIAL (DRAPES) IMPLANT
GLIDESHEATH SLEND SS 6F .021 (SHEATH) IMPLANT
GUIDEWIRE INQWIRE 1.5J.035X260 (WIRE) IMPLANT
INQWIRE 1.5J .035X260CM (WIRE) ×1
KIT ENCORE 26 ADVANTAGE (KITS) IMPLANT
PACK CARDIAC CATH (CUSTOM PROCEDURE TRAY) ×1 IMPLANT
PROTECTION STATION PRESSURIZED (MISCELLANEOUS) ×1
SET ATX SIMPLICITY (MISCELLANEOUS) IMPLANT
STATION PROTECTION PRESSURIZED (MISCELLANEOUS) IMPLANT
STENT ONYX FRONTIER 3.0X15 (Permanent Stent) IMPLANT
TUBING CIL FLEX 10 FLL-RA (TUBING) IMPLANT
WIRE RUNTHROUGH .014X180CM (WIRE) IMPLANT

## 2022-08-23 NOTE — Assessment & Plan Note (Addendum)
-  The patient is admitted to the ICU s/p PCI and RCA stent. -We will continue on aspirin and Plavix. - High-dose statin therapy will be resumed and Toprol-XL will be continued as well as ARB therapy. - Dr. Humphrey Rolls the patient's cardiologist will be following in AM.  I notified him.

## 2022-08-23 NOTE — Consult Note (Signed)
CARDIOLOGY CONSULT NOTE               Patient ID: Anthony Mcguire MRN: [id number] DOB/AGE: 60/25/1962 60 y.o.  Admit date: 08/22/2022 Referring Physician Dr. Emeterio Reeve hospitalist Primary Physician Dr. Devona Konig Primary Cardiologist Neoma Laming MD Reason for Consultation STEMI RCA  HPI: Patient is a 62 year old male multiple medical problems including coronary artery disease hypertension hyperlipidemia with recurrent anginal symptoms smoking states has been having problems over the last several days with unstable angina symptoms but did not seek medical attention until the pain got bad enough that he came in and was found to be a code STEMI with ST elevation inferiorly patient was taken to the Cath Lab by Dr. Fletcher Anon.  Patient underwent PCI and stent for RCA occlusion with DES patient is now pain-free resting comfortably in bed still on aspirin Plavix beta-blocker ARB statin EKG improved symptoms improve patient to be transferred to telemetry  Review of systems complete and found to be negative unless listed above     Past Medical History:  Diagnosis Date   Coronary artery disease    Gallstones 2019   Hyperlipidemia    Hypertension    Kidney stones 2019    Past Surgical History:  Procedure Laterality Date   CORONARY ANGIOPLASTY WITH STENT PLACEMENT     LEFT HEART CATH AND CORONARY ANGIOGRAPHY Left 04/02/2020   Procedure: LEFT HEART CATH AND CORONARY ANGIOGRAPHY with possible percutaneous intervention;  Surgeon: Dionisio David, MD;  Location: [place] [facility];  Service: Cardiovascular;  Laterality: Left;   TOTAL HIP ARTHROPLASTY Right 2000    Medications Prior to Admission  Medication Sig Dispense Refill Last Dose   albuterol (PROVENTIL HFA;VENTOLIN HFA) 108 (90 Base) MCG/ACT inhaler Inhale 2 puffs into the lungs every 6 (six) hours as needed for wheezing or shortness of breath. 1 Inhaler 2    aspirin EC 81 MG tablet Take 81 mg by mouth daily. Swallow whole.       clopidogrel (PLAVIX) 75 MG tablet Take 75 mg by mouth daily.      cyclobenzaprine (FLEXERIL) 10 MG tablet Take 1 tablet (10 mg total) by mouth 3 (three) times daily as needed for muscle spasms. 30 tablet 0    diclofenac sodium (VOLTAREN) 1 % GEL Apply 2 g topically 4 (four) times daily. (Patient not taking: Reported on 04/02/2020) 2 g 0    ibuprofen (ADVIL,MOTRIN) 800 MG tablet Take 1 tablet (800 mg total) by mouth every 8 (eight) hours as needed for moderate pain. (Patient not taking: Reported on 04/02/2020) 15 tablet 0    losartan-hydrochlorothiazide (HYZAAR) 50-12.5 MG tablet Take 1 tablet by mouth daily.      metoprolol succinate (TOPROL-XL) 50 MG 24 hr tablet Take 50 mg by mouth daily. Take with or immediately following a meal.      ondansetron (ZOFRAN ODT) 4 MG disintegrating tablet Take 1 tablet (4 mg total) by mouth every 8 (eight) hours as needed for nausea or vomiting. (Patient not taking: Reported on 04/02/2020) 30 tablet 0    oxyCODONE-acetaminophen (ROXICET) 5-325 MG tablet Take 1 tablet by mouth every 4 (four) hours as needed for severe pain. (Patient not taking: Reported on 04/02/2020) 30 tablet 0    rosuvastatin (CRESTOR) 40 MG tablet Take 40 mg by mouth daily.      Social History   Socioeconomic History   Marital status: Legally Separated    Spouse name: Marliss Czar   Number of children: 0   Years of  education: Not on file   Highest education level: Not on file  Occupational History   Not on file  Tobacco Use   Smoking status: Every Day    Packs/day: 1.00    Types: Cigarettes   Smokeless tobacco: Never  Vaping Use   Vaping Use: Never used  Substance and Sexual Activity   Alcohol use: Yes   Drug use: No   Sexual activity: Not on file  Other Topics Concern   Not on file  Social History Narrative   ** Merged History Encounter **step children 1 living : 1 died (murdered ) 26-Mar-2020  Illegal immigrant          Social Determinants of Corporate investment banker Strain:  Not on file  Food Insecurity: Not on file  Transportation Needs: Not on file  Physical Activity: Not on file  Stress: Not on file  Social Connections: Not on file  Intimate Partner Violence: Not on file    History reviewed. No pertinent family history.    Review of systems complete and found to be negative unless listed above      PHYSICAL EXAM  General: Well developed, well nourished, in no acute distress HEENT:  Normocephalic and atramatic Neck:  No JVD.  Lungs: Clear bilaterally to auscultation and percussion. Heart: HRRR . Normal S1 and S2 without gallops or murmurs.  Abdomen: Bowel sounds are positive, abdomen soft and non-tender  Msk:  Back normal, normal gait. Normal strength and tone for age. Extremities: No clubbing, cyanosis or edema.   Neuro: Alert and oriented X 3. Psych:  Good affect, responds appropriately  Labs:   Lab Results  Component Value Date   WBC 11.9 (H) 08/23/2022   HGB 13.2 08/23/2022   HCT 39.9 08/23/2022   MCV 85.1 08/23/2022   PLT 240 08/23/2022    Recent Labs  Lab 08/22/22 2349 08/23/22 0804  NA 129* 130*  K 3.2* 3.8  CL 93* 100  CO2 23 21*  BUN 19 16  CREATININE 1.62* 1.31*  CALCIUM 9.5 8.6*  PROT 7.4  --   BILITOT 1.0  --   ALKPHOS 60  --   ALT 31  --   AST 46*  --   GLUCOSE 296* 194*   Lab Results  Component Value Date   CKTOTAL 438 (H) 08/23/2011   CKMB 32.5 (H) 08/23/2011   TROPONINI <0.03 10/10/2017   No results found for: "CHOL" No results found for: "HDL" No results found for: "LDLCALC" No results found for: "TRIG" No results found for: "CHOLHDL" No results found for: "LDLDIRECT"    Radiology: CARDIAC CATHETERIZATION  Result Date: 08/23/2022   Prox RCA-2 lesion is 20% stenosed.   Mid RCA-2 lesion is 100% stenosed.   1st Mrg lesion is 90% stenosed.   Mid Cx lesion is 30% stenosed.   Mid LAD lesion is 20% stenosed.   Prox RCA-1 lesion is 30% stenosed.   Mid RCA-1 lesion is 30% stenosed.   A drug-eluting stent  was successfully placed using a STENT ONYX FRONTIER 3.0X15.   Post intervention, there is a 0% residual stenosis. 1.  Inferior ST elevation myocardial infarction due to thrombotic occlusion of the mid/distal right coronary artery.  Patent stents in the proximal RCA and mid LAD with mild in-stent restenosis.  No other obstructive disease. 2.  Left ventricular angiography was not performed due to chronic kidney disease.  LVEDP was mildly elevated. 3.  Successful angioplasty and drug-eluting stent placement to the  right coronary artery. Recommendations: Continue dual antiplatelet therapy for at least 12 months.  The patient did not tolerate ticagrelor in the past very well and thus I elected not to switch him. Obtain an echocardiogram to evaluate ejection fraction. Aggressive treatment of risk factors and smoking cessation. Please note that the patient's initial EKG on arrival was not diagnostic for STEMI.  However, he had recurrent chest pain and a repeat EKG was consistent with inferior STEMI and thus emergent cath was recommended.   DG Chest Port 1 View  Result Date: 08/23/2022 CLINICAL DATA:  Chest pain EXAM: PORTABLE CHEST 1 VIEW COMPARISON:  03/19/2020 FINDINGS: Heart and mediastinal contours are within normal limits. No focal opacities or effusions. No acute bony abnormality. No pneumothorax. IMPRESSION: No active disease. Electronically Signed   By: Rolm Baptise M.D.   On: 08/23/2022 00:18    EKG: Initial EKG had diffuse ST elevation inferiorly reciprocal depressions laterally   ASSESSMENT AND PLAN:  Status post STEMI RCA Multivessel coronary artery disease Unstable angina Obesity Hyperlipidemia Hypertension Smoking Chronic renal insufficiency stage II  S/P STEMI RCA status post PCI and stent Dr. Fletcher Anon DES patient stable continue Plavix aspirin beta-blocker ARB statin CAD known recent acute episode STEMI pain-free continue current therapy HTN reasonably controlled continue losartan HCTZ  metoprolol Hyperlipidemia continue Crestor therapy for hyperlipidemia History of tobacco abuse advised patient to quit smoking Episode of hemoptysis self-limiting unclear etiology will continue to follow H&H's Refer the patient to cardiac rehab hopefully anticipate discharge within the next 24 to 48 hours Patient to be transferred to Dr. Humphrey Rolls service in the morning     Signed: Yolonda Kida MD 08/23/2022, 9:22 AM

## 2022-08-23 NOTE — ED Notes (Signed)
Transported patient to cath lab.  

## 2022-08-23 NOTE — Assessment & Plan Note (Signed)
-  He was hypotensive in the ICU and was given normal saline bolus. - Will cautiously resume his antihypertensives with improvement of his blood pressure.

## 2022-08-23 NOTE — Hospital Course (Addendum)
Anthony Mcguire is a 62 y.o.  male with medical history significant for coronary artery disease, hypertension, dyslipidemia, urolithiasis and cholelithiasis, who presented to the emergency room late night hours 08/22/2021 with acute onset of intermittent chest pain over the last week but got significantly worse since last night with associated nausea and abdominal pain.    01/13-01/14: in ED repeat EKG showed 1 mm ST segment elevation inferiorly with reciprocal changes laterally.  Code STEMI was activated and the patient was taken to the Cath Lab by Dr. Fletcher Anon and underwent PCI and stent placement for RCA occlusion. BP was 145/83 with otherwise normal vital signs.  Labs revealed hyponatremia and hypokalemia and hypochloremia with hyperglycemia and creatinine 1.62 close to baseline and high sensitive troponin I was 6,098 and later 10,509.   Consultants:  Cardiology  Procedures: 08/23/22 Cardiac catheterization w/ R PCA PCI      ASSESSMENT & PLAN:   Active Problems:   ST-segment elevation myocardial infarction (STEMI) of inferior wall (HCC)   Hemoptysis   Essential hypertension   Dyslipidemia   ST elevation myocardial infarction involving right coronary artery (HCC)   ST-segment elevation myocardial infarction (STEMI) of inferior wall [facility]) ICU s/p PCI and RCA stent. continue on aspirin and Plavix  High potency statin, beta blocker, ARB Dr. Humphrey Rolls the patient's cardiologist will be following in AM per admitting physician H&P   Essential hypertension was hypotensive in the ICU and was given normal saline bolus. cautiously resume his antihypertensives with improvement of his blood pressure.  Dyslipidemia statin therapy.  Hemoptysis mild and self-limited. aspirin and Plavix with benefits outweighing risks s/p PCI and stent. monitor H&H  provide mucolytic therapy prn    DVT prophylaxis: SCD and ambulation Pertinent IV fluids/nutrition: no continuous IV fluids  Central lines /  invasive devices: none  Code Status: FULL CODE  Current Admission Status: inpatietn  TOC needs / Dispo plan: none at this time, expect return home Barriers to discharge / significant pending items: cardiology clearance prior to discharge, potentially next 1-2 days

## 2022-08-23 NOTE — ED Notes (Signed)
Activated Code Stemi, spoke w/Ruby

## 2022-08-23 NOTE — Consult Note (Signed)
Cardiology Consultation   Patient ID: Anthony Mcguire MRN: [id number]; DOB: 12-23-1960  Admit date: 08/22/2022 Date of Consult: 08/23/2022  PCP:  Allyne Gee, MD   [facility] Prov[facility] Cardiologist:  Dr. Humphrey Rolls   Patient Profile:   Anthony Mcguire is a 62 y.o. male with a hx of coronary artery disease status post RCA and LAD PCI who is being seen 08/23/2022 for the evaluation of possible inferior STEMI at the request of Dr. Bland Span.  History of Present Illness:   Anthony Mcguire is a 62 year old male with known history of coronary artery disease.  He presented in 2021 with unstable angina.  Cardiac catheterization showed severe two-vessel coronary artery disease involving the LAD and right coronary artery.  He was transferred to [facility] to consider CABG but underwent RCA and LAD PCI with drug-eluting stent placement.  Other medical history is include hyperlipidemia, essential hypertension and tobacco use.  He continues to smoke about 20 cigarettes a day.  He has been following with Dr. Humphrey Rolls. He reports intermittent chest pain over the last 1 week that intensified last night and was severe.  In addition, he had recent symptoms of nausea, abdominal pain and intermittent cough.  He coughed up small amount of blood last night. His initial EKG at 2341 was not diagnostic for STEMI.  He had recurrent chest pain and a repeat EKG was done at 12:52 AM which showed 1 mm of ST elevation in lead III as well as aVF with minor reciprocal changes in 1 and aVL.  Based on this, code STEMI was activated.   Past Medical History:  Diagnosis Date   Coronary artery disease    Gallstones 2019   Hyperlipidemia    Hypertension    Kidney stones 2019    Past Surgical History:  Procedure Laterality Date   CORONARY ANGIOPLASTY WITH STENT PLACEMENT     LEFT HEART CATH AND CORONARY ANGIOGRAPHY Left 04/02/2020   Procedure: LEFT HEART CATH AND CORONARY ANGIOGRAPHY with possible percutaneous intervention;   Surgeon: Dionisio David, MD;  Location: [facility];  Service: Cardiovascular;  Laterality: Left;   TOTAL HIP ARTHROPLASTY Right 2000     Home Medications:  Prior to Admission medications   Medication Sig Start Date End Date Taking? Authorizing Provider  albuterol (PROVENTIL HFA;VENTOLIN HFA) 108 (90 Base) MCG/ACT inhaler Inhale 2 puffs into the lungs every 6 (six) hours as needed for wheezing or shortness of breath. 10/10/17   Merlyn Lot, MD  aspirin EC 81 MG tablet Take 81 mg by mouth daily. Swallow whole.    [provider]  clopidogrel (PLAVIX) 75 MG tablet Take 75 mg by mouth daily.    [provider]  cyclobenzaprine (FLEXERIL) 10 MG tablet Take 1 tablet (10 mg total) by mouth 3 (three) times daily as needed for muscle spasms. 03/18/17   Rudene Re, MD  diclofenac sodium (VOLTAREN) 1 % GEL Apply 2 g topically 4 (four) times daily. Patient not taking: Reported on 04/02/2020 03/18/17   Rudene Re, MD  ibuprofen (ADVIL,MOTRIN) 800 MG tablet Take 1 tablet (800 mg total) by mouth every 8 (eight) hours as needed for moderate pain. Patient not taking: Reported on 04/02/2020 03/20/17   Paulette Blanch, MD  losartan-hydrochlorothiazide [facility]) 50-12.5 MG tablet Take 1 tablet by mouth daily.    [provider]  metoprolol succinate (TOPROL-XL) 50 MG 24 hr tablet Take 50 mg by mouth daily. Take with or immediately following a meal.    [provider]  ondansetron (ZOFRAN ODT) 4 MG disintegrating tablet Take 1 tablet (4 mg total) by mouth every 8 (eight) hours as needed for nausea or vomiting. Patient not taking: Reported on 04/02/2020 03/20/17   Irean Hong, MD  oxyCODONE-acetaminophen (ROXICET) 5-325 MG tablet Take 1 tablet by mouth every 4 (four) hours as needed for severe pain. Patient not taking: Reported on 04/02/2020 03/20/17   Irean Hong, MD  rosuvastatin (CRESTOR) 40 MG tablet Take 40 mg by mouth daily.    [provider]     Inpatient Medications: Scheduled Meds:  Continuous Infusions:  sodium chloride Stopped (08/23/22 0142)   heparin Stopped (08/23/22 0142)   PRN Meds: nitroGLYCERIN  Allergies:    Allergies  Allergen Reactions   Penicillins     Social History:   Social History   Socioeconomic History   Marital status: Legally Separated    Spouse name: Leigh   Number of children: 0   Years of education: Not on file   Highest education level: Not on file  Occupational History   Not on file  Tobacco Use   Smoking status: Every Day    Packs/day: 1.00    Types: Cigarettes   Smokeless tobacco: Never  Vaping Use   Vaping Use: Never used  Substance and Sexual Activity   Alcohol use: Yes   Drug use: No   Sexual activity: Not on file  Other Topics Concern   Not on file  Social History Narrative   ** Merged History Encounter **step children 1 living : 1 died (murdered ) April 04, 2020  Illegal immigrant          Social Determinants of Corporate investment banker Strain: Not on file  Food Insecurity: Not on file  Transportation Needs: Not on file  Physical Activity: Not on file  Stress: Not on file  Social Connections: Not on file  Intimate Partner Violence: Not on file    Family History:   History reviewed. No pertinent family history.   ROS:  Please see the history of present illness.   All other ROS reviewed and negative.     Physical Exam/Data:   Vitals:   08/23/22 0122 08/23/22 0123 08/23/22 0147 08/23/22 0253  BP:    104/68  Pulse: 97 69  63  Resp: 14 17  18   Temp:    97.8 F (36.6 C)  TempSrc:    Oral  SpO2: 100% 99% (!) 2% 99%  Weight:    99.2 kg  Height:       No intake or output data in the 24 hours ending 08/23/22 0302    08/23/2022    2:53 AM 08/22/2022   11:35 PM 04/02/2020    8:25 AM  Last 3 Weights  Weight (lbs) 218 lb 11.1 oz 225 lb 243 lb  Weight (kg) 99.2 kg 102.059 kg 110.224 kg     Body mass index is 30.08 kg/m.  General:  Well nourished,  well developed, in no acute distress HEENT: normal Neck: no JVD Vascular: No carotid bruits; Distal pulses 2+ bilaterally Cardiac:  normal S1, S2; RRR; no murmur  Lungs:  clear to auscultation bilaterally, no wheezing, rhonchi or rales  Abd: soft, nontender, no hepatomegaly  Ext: no edema Musculoskeletal:  No deformities, BUE and BLE strength normal and equal Skin: warm and dry  Neuro:  CNs 2-12 intact, no focal abnormalities noted Psych:  Normal affect   EKG:  The EKG was personally reviewed and  demonstrates: EKGs are described above. Telemetry:  Telemetry was personally reviewed and demonstrates:    Relevant CV Studies:   Laboratory Data:  High Sensitivity Troponin:   Recent Labs  Lab 08/22/22 2349 08/23/22 0130  TROPONINIHS 6,098* 10,509*     Chemistry Recent Labs  Lab 08/22/22 2349  NA 129*  K 3.2*  CL 93*  CO2 23  GLUCOSE 296*  BUN 19  CREATININE 1.62*  CALCIUM 9.5  GFRNONAA 49*  ANIONGAP 13    Recent Labs  Lab 08/22/22 2349  PROT 7.4  ALBUMIN 3.9  AST 46*  ALT 31  ALKPHOS 60  BILITOT 1.0   Lipids No results for input(s): "CHOL", "TRIG", "HDL", "LABVLDL", "LDLCALC", "CHOLHDL" in the last 168 hours.  Hematology Recent Labs  Lab 08/22/22 2349  WBC 15.4*  RBC 5.22  HGB 14.7  HCT 42.6  MCV 81.6  MCH 28.2  MCHC 34.5  RDW 13.2  PLT 291   Thyroid No results for input(s): "TSH", "FREET4" in the last 168 hours.  BNPNo results for input(s): "BNP", "PROBNP" in the last 168 hours.  DDimer No results for input(s): "DDIMER" in the last 168 hours.   Radiology/Studies:  CARDIAC CATHETERIZATION  Result Date: 08/23/2022   Prox RCA-2 lesion is 20% stenosed.   Mid RCA-2 lesion is 100% stenosed.   1st Mrg lesion is 90% stenosed.   Mid Cx lesion is 30% stenosed.   Mid LAD lesion is 20% stenosed.   Prox RCA-1 lesion is 30% stenosed.   Mid RCA-1 lesion is 30% stenosed.   A drug-eluting stent was successfully placed using a STENT ONYX FRONTIER 3.0X15.   Post  intervention, there is a 0% residual stenosis. 1.  Inferior ST elevation myocardial infarction due to thrombotic occlusion of the mid/distal right coronary artery.  Patent stents in the proximal RCA and mid LAD with mild in-stent restenosis.  No other obstructive disease. 2.  Left ventricular angiography was not performed due to chronic kidney disease.  LVEDP was mildly elevated. 3.  Successful angioplasty and drug-eluting stent placement to the right coronary artery. Recommendations: Continue dual antiplatelet therapy for at least 12 months.  The patient did not tolerate ticagrelor in the past very well and thus I elected not to switch him. Obtain an echocardiogram to evaluate ejection fraction. Aggressive treatment of risk factors and smoking cessation. Please note that the patient's initial EKG on arrival was not diagnostic for STEMI.  However, he had recurrent chest pain and a repeat EKG was consistent with inferior STEMI and thus emergent cath was recommended.   DG Chest Port 1 View  Result Date: 08/23/2022 CLINICAL DATA:  Chest pain EXAM: PORTABLE CHEST 1 VIEW COMPARISON:  03/19/2020 FINDINGS: Heart and mediastinal contours are within normal limits. No focal opacities or effusions. No acute bony abnormality. No pneumothorax. IMPRESSION: No active disease. Electronically Signed   By: Charlett Nose M.D.   On: 08/23/2022 00:18     Assessment and Plan:   Inferior ST elevation myocardial infarction: Emergent cardiac catheterization was done via the right radial artery which showed patent stents in the LAD and right coronary artery.  However, the mid to distal right coronary artery was occluded.  I performed successful angioplasty and drug-eluting stent placement.  Recommend continuing dual antiplatelet therapy with aspirin and Plavix for at least 12 months.  Recommend aggressive treatment of risk factors. Suspected ischemic cardiomyopathy: I requested an echocardiogram.  Continue Toprol and  losartan. Essential hypertension: He was on Toprol and losartan-hydrochlorothiazide as  an outpatient.  I discontinued hydrochlorothiazide. Hyperlipidemia: Continue high-dose rosuvastatin. Tobacco use: I discussed the importance of smoking cessation. Possible viral illness: He reports intermittent GI symptoms and cough over the last week but seems to be improved now.  COVID and flu were both negative.  He reported some hemoptysis but seems to be small amount overall and hemoglobin is stable.  Continue to monitor.  Patient is followed by Dr. Humphrey Rolls as an outpatient.  I sent a secure chat message for [facility]/Dr. Humphrey Rolls to follow as an inpatient.   Risk Assessment/Risk Scores:     TIMI Risk Score for ST  Elevation MI:   The patient's TIMI risk score is 1, which indicates a 1.6% risk of all cause mortality at 30 days.{  For questions or updates, please contact [place] Please consult [email or web address] for contact info under    Signed, Kathlyn Sacramento, MD  08/23/2022 3:02 AM

## 2022-08-23 NOTE — Progress Notes (Signed)
Pt A&OX4. VSS. Tolerating diet. Denies chest pain this shift. R radial site level zero with gauze intact. Report called to 2A RN.

## 2022-08-23 NOTE — Assessment & Plan Note (Signed)
-  We will continue statin therapy. 

## 2022-08-23 NOTE — H&P (Signed)
[facility]   PATIENT NAME: Anthony Mcguire    MR#:  [id number]  DATE OF BIRTH:  12-Nov-1960  DATE OF ADMISSION:  08/22/2022  PRIMARY CARE PHYSICIAN: Allyne Gee, MD   Patient is coming from: Home  REQUESTING/REFERRING PHYSICIAN: Kathlyn Sacramento, MD  CHIEF COMPLAINT:   Chief Complaint  Patient presents with   Chest Pain   Hemoptysis    HISTORY OF PRESENT ILLNESS:  Anthony Mcguire is a 62 y.o.  male with medical history significant for coronary artery disease, hypertension, dyslipidemia, urolithiasis and cholelithiasis, who presented to the emergency room with acute onset of intermittent chest pain over the last week but got significantly worse since last night with associated nausea and abdominal pain.  He has been having intermittent cough with small amount of hemoptysis last night.  His initial EKG was nondiagnostic for STEMI but he continued to have recurrent chest pain and repeat EKG showed 1 mm ST segment elevation inferiorly with reciprocal changes laterally.  Code STEMI was activated and the patient was taken to the Cath Lab by Dr. Fletcher Anon and underwent PCI and stent placement for RCA occlusion.  He denies any recent fever or chills.  No headache or dizziness or blurred vision.  No dysuria, oliguria or hematuria or flank pain.  No other bleeding diathesis.  ED Course: When he came to the ER, BP was 145/83 with otherwise normal vital signs.  Labs revealed hyponatremia and hypokalemia and hypochloremia with hyperglycemia and creatinine 1.62 close to baseline and high sensitive troponin I was 6,098 and later 10,509. EKG as reviewed by me : As above Imaging: Portable chest ray showed no acute cardiopulmonary disease.  The patient was initially given 4 mg of IV morphine sulfate and 4 mg of IV Zofran and 500 mL IV normal saline bolus and 40 aspirin.  He is admitted to the ICU status post PCI for further evaluation and management. PAST MEDICAL HISTORY:   Past Medical History:   Diagnosis Date   Coronary artery disease    Gallstones 2019   Hyperlipidemia    Hypertension    Kidney stones 2019    PAST SURGICAL HISTORY:   Past Surgical History:  Procedure Laterality Date   CORONARY ANGIOPLASTY WITH STENT PLACEMENT     LEFT HEART CATH AND CORONARY ANGIOGRAPHY Left 04/02/2020   Procedure: LEFT HEART CATH AND CORONARY ANGIOGRAPHY with possible percutaneous intervention;  Surgeon: Dionisio David, MD;  Location: [facility];  Service: Cardiovascular;  Laterality: Left;   TOTAL HIP ARTHROPLASTY Right 2000    SOCIAL HISTORY:   Social History   Tobacco Use   Smoking status: Every Day    Packs/day: 1.00    Types: Cigarettes   Smokeless tobacco: Never  Substance Use Topics   Alcohol use: Yes    FAMILY HISTORY:   Positive for premature coronary artery disease.  DRUG ALLERGIES:   Allergies  Allergen Reactions   Penicillins     REVIEW OF SYSTEMS:   ROS As per history of present illness. All pertinent systems were reviewed above. Constitutional, HEENT, cardiovascular, respiratory, GI, GU, musculoskeletal, neuro, psychiatric, endocrine, integumentary and hematologic systems were reviewed and are otherwise negative/unremarkable except for positive findings mentioned above in the HPI.   MEDICATIONS AT HOME:   Prior to Admission medications   Medication Sig Start Date End Date Taking? Authorizing Provider  albuterol (PROVENTIL HFA;VENTOLIN HFA) 108 (90 Base) MCG/ACT inhaler Inhale 2 puffs into the lungs every 6 (six) hours as  needed for wheezing or shortness of breath. 10/10/17   Merlyn Lot, MD  aspirin EC 81 MG tablet Take 81 mg by mouth daily. Swallow whole.    [provider]  clopidogrel (PLAVIX) 75 MG tablet Take 75 mg by mouth daily.    [provider]  cyclobenzaprine (FLEXERIL) 10 MG tablet Take 1 tablet (10 mg total) by mouth 3 (three) times daily as needed for muscle spasms. 03/18/17   Rudene Re, MD   diclofenac sodium (VOLTAREN) 1 % GEL Apply 2 g topically 4 (four) times daily. Patient not taking: Reported on 04/02/2020 03/18/17   Rudene Re, MD  ibuprofen (ADVIL,MOTRIN) 800 MG tablet Take 1 tablet (800 mg total) by mouth every 8 (eight) hours as needed for moderate pain. Patient not taking: Reported on 04/02/2020 03/20/17   Paulette Blanch, MD  losartan-hydrochlorothiazide [facility]) 50-12.5 MG tablet Take 1 tablet by mouth daily.    [provider]  metoprolol succinate (TOPROL-XL) 50 MG 24 hr tablet Take 50 mg by mouth daily. Take with or immediately following a meal.    [provider]  ondansetron (ZOFRAN ODT) 4 MG disintegrating tablet Take 1 tablet (4 mg total) by mouth every 8 (eight) hours as needed for nausea or vomiting. Patient not taking: Reported on 04/02/2020 03/20/17   Paulette Blanch, MD  oxyCODONE-acetaminophen (ROXICET) 5-325 MG tablet Take 1 tablet by mouth every 4 (four) hours as needed for severe pain. Patient not taking: Reported on 04/02/2020 03/20/17   Paulette Blanch, MD  rosuvastatin (CRESTOR) 40 MG tablet Take 40 mg by mouth daily.    [provider]      VITAL SIGNS:  Blood pressure 96/69, pulse 66, temperature 97.8 F (36.6 C), temperature source Oral, resp. rate (!) 21, height 5' 11.5" (1.816 m), weight 99.2 kg, SpO2 99 %.  PHYSICAL EXAMINATION:  Physical Exam  GENERAL:  62 y.o.-year-old male patient lying in the bed with no acute distress.  EYES: Pupils equal, round, reactive to light and accommodation. No scleral icterus. Extraocular muscles intact.  HEENT: Head atraumatic, normocephalic. Oropharynx and nasopharynx clear.  NECK:  Supple, no jugular venous distention. No thyroid enlargement, no tenderness.  LUNGS: Normal breath sounds bilaterally, no wheezing, rales,rhonchi or crepitation. No use of accessory muscles of respiration.  CARDIOVASCULAR: Regular rate and rhythm, S1, S2 normal. No murmurs, rubs, or gallops.  ABDOMEN: Soft,  nondistended, nontender. Bowel sounds present. No organomegaly or mass.  EXTREMITIES: No pedal edema, cyanosis, or clubbing.  NEUROLOGIC: Cranial nerves II through XII are intact. Muscle strength 5/5 in all extremities. Sensation intact. Gait not checked.  PSYCHIATRIC: The patient is alert and oriented x 3.  Normal affect and good eye contact. SKIN: No obvious rash, lesion, or ulcer.   LABORATORY PANEL:   CBC Recent Labs  Lab 08/22/22 2349  WBC 15.4*  HGB 14.7  HCT 42.6  PLT 291   ------------------------------------------------------------------------------------------------------------------  Chemistries  Recent Labs  Lab 08/22/22 2349  NA 129*  K 3.2*  CL 93*  CO2 23  GLUCOSE 296*  BUN 19  CREATININE 1.62*  CALCIUM 9.5  AST 46*  ALT 31  ALKPHOS 60  BILITOT 1.0   ------------------------------------------------------------------------------------------------------------------  Cardiac Enzymes No results for input(s): "TROPONINI" in the last 168 hours. ------------------------------------------------------------------------------------------------------------------  RADIOLOGY:  CARDIAC CATHETERIZATION  Result Date: 08/23/2022   Prox RCA-2 lesion is 20% stenosed.   Mid RCA-2 lesion is 100% stenosed.   1st Mrg lesion is 90% stenosed.   Mid Cx lesion  is 30% stenosed.   Mid LAD lesion is 20% stenosed.   Prox RCA-1 lesion is 30% stenosed.   Mid RCA-1 lesion is 30% stenosed.   A drug-eluting stent was successfully placed using a STENT ONYX FRONTIER 3.0X15.   Post intervention, there is a 0% residual stenosis. 1.  Inferior ST elevation myocardial infarction due to thrombotic occlusion of the mid/distal right coronary artery.  Patent stents in the proximal RCA and mid LAD with mild in-stent restenosis.  No other obstructive disease. 2.  Left ventricular angiography was not performed due to chronic kidney disease.  LVEDP was mildly elevated. 3.  Successful angioplasty and  drug-eluting stent placement to the right coronary artery. Recommendations: Continue dual antiplatelet therapy for at least 12 months.  The patient did not tolerate ticagrelor in the past very well and thus I elected not to switch him. Obtain an echocardiogram to evaluate ejection fraction. Aggressive treatment of risk factors and smoking cessation. Please note that the patient's initial EKG on arrival was not diagnostic for STEMI.  However, he had recurrent chest pain and a repeat EKG was consistent with inferior STEMI and thus emergent cath was recommended.   DG Chest Port 1 View  Result Date: 08/23/2022 CLINICAL DATA:  Chest pain EXAM: PORTABLE CHEST 1 VIEW COMPARISON:  03/19/2020 FINDINGS: Heart and mediastinal contours are within normal limits. No focal opacities or effusions. No acute bony abnormality. No pneumothorax. IMPRESSION: No active disease. Electronically Signed   By: Rolm Baptise M.D.   On: 08/23/2022 00:18      IMPRESSION AND PLAN:  Assessment and Plan: ST-segment elevation myocardial infarction (STEMI) of inferior wall (HCC) - The patient is admitted to the ICU s/p PCI and RCA stent. -We will continue on aspirin and Plavix. - High-dose statin therapy will be resumed and Toprol-XL will be continued as well as ARB therapy. - Dr. Humphrey Rolls the patient's cardiologist will be following in AM.  I notified him.   Hemoptysis - This was mild and self-limited. - He is currently on aspirin and Plavix with benefits outweighing risks s/p PCI and stent. - We will monitor his H&H and provide mucolytic therapy.  Essential hypertension - He was hypotensive in the ICU and was given normal saline bolus. - Will cautiously resume his antihypertensives with improvement of his blood pressure.   Dyslipidemia - We will continue statin therapy.   DVT prophylaxis: SCDs and ambulation.   Advanced Care Planning:  Code Status: full code.  Family Communication:  The plan of care was discussed in  details with the patient (and family). I answered all questions. The patient agreed to proceed with the above mentioned plan. Further management will depend upon hospital course. Disposition Plan: Back to previous home environment Consults called: Cardiology All the records are reviewed and case discussed with ED provider.  Status is: Inpatient  At the time of the admission, it appears that the appropriate admission status for this patient is inpatient.  This is judged to be reasonable and necessary in order to provide the required intensity of service to ensure the patient's safety given the presenting symptoms, physical exam findings and initial radiographic and laboratory data in the context of comorbid conditions.  The patient requires inpatient status due to high intensity of service, high risk of further deterioration and high frequency of surveillance required.  I certify that at the time of admission, it is my clinical judgment that the patient will require inpatient hospital care extending more than 2 midnights.  Dispo: The patient is from: Home              Anticipated d/c is to: Home              Patient currently is not medically stable to d/c.              Difficult to place patient: No  Hannah Beat M.D on 08/23/2022 at 4:26 AM  [facility]   From 7 PM-7 AM, contact night-coverage [email or web address]  CC: Primary care physician; Yevonne Pax, MD

## 2022-08-23 NOTE — Assessment & Plan Note (Signed)
-  This was mild and self-limited. - He is currently on aspirin and Plavix with benefits outweighing risks s/p PCI and stent. - We will monitor his H&H and provide mucolytic therapy.

## 2022-08-23 NOTE — Progress Notes (Signed)
PROGRESS NOTE    Anthony LaurenceWilliam Milburn   ZOX:[id number] DOB: 03/21/1961  DOA: 08/22/2022 Date of Service: 08/23/22 PCP: Yevonne PaxKhan, Saadat A, MD     Brief Narrative / Hospital Course:  Anthony Mcguire is a 62 y.o.  male with medical history significant for coronary artery disease, hypertension, dyslipidemia, urolithiasis and cholelithiasis, who presented to the emergency room late night hours 08/22/2021 with acute onset of intermittent chest pain over the last week but got significantly worse since last night with associated nausea and abdominal pain.    01/13-01/14: in ED repeat EKG showed 1 mm ST segment elevation inferiorly with reciprocal changes laterally.  Code STEMI was activated and the patient was taken to the Cath Lab by Dr. Kirke CorinArida and underwent PCI and stent placement for RCA occlusion. BP was 145/83 with otherwise normal vital signs.  Labs revealed hyponatremia and hypokalemia and hypochloremia with hyperglycemia and creatinine 1.62 close to baseline and high sensitive troponin I was 6,098 and later 10,509.   Consultants:  Cardiology  Procedures: 08/23/22 Cardiac catheterization w/ R PCA PCI      ASSESSMENT & PLAN:   Active Problems:   ST-segment elevation myocardial infarction (STEMI) of inferior wall (HCC)   Hemoptysis   Essential hypertension   Dyslipidemia   ST elevation myocardial infarction involving right coronary artery (HCC)   ST-segment elevation myocardial infarction (STEMI) of inferior wall [facility](HCC) ICU s/p PCI and RCA stent. continue on aspirin and Plavix  High potency statin, beta blocker, ARB Dr. Welton FlakesKhan the patient's cardiologist will be following in AM per admitting physician H&P   Essential hypertension was hypotensive in the ICU and was given normal saline bolus. cautiously resume his antihypertensives with improvement of his blood pressure.  Dyslipidemia statin therapy.  Hemoptysis mild and self-limited. aspirin and Plavix with benefits outweighing  risks s/p PCI and stent. monitor H&H  provide mucolytic therapy prn    DVT prophylaxis: SCD and ambulation Pertinent IV fluids/nutrition: no continuous IV fluids  Central lines / invasive devices: none  Code Status: FULL CODE  Current Admission Status: inpatietn  TOC needs / Dispo plan: none at this time, expect return home Barriers to discharge / significant pending items: cardiology clearance prior to discharge, potentially next 1-2 days              Subjective / Brief ROS:  Patient reports chest pain resolved Denies SOB.  Pain controlled.  Denies new weakness.  Tolerating diet.  Reports no concerns w/ urination/defecation.   Family Communication: none at this time     Objective Findings:  Vitals:   08/23/22 1000 08/23/22 1100 08/23/22 1200 08/23/22 1300  BP: 93/66 91/66 98/67  95/71  Pulse: 73 70 72 65  Resp: (!) 22 (!) 21 (!) 0 16  Temp:    99.1 F (37.3 C)  TempSrc:    Oral  SpO2: 97% 95% 99% 98%  Weight:      Height:        Intake/Output Summary (Last 24 hours) at 08/23/2022 1352 Last data filed at 08/23/2022 1300 Gross per 24 hour  Intake 1459.92 ml  Output 1550 ml  Net -90.08 ml   Filed Weights   08/22/22 2335 08/23/22 0253  Weight: 102.1 kg 99.2 kg    Examination:  Physical Exam Constitutional:      General: He is not in acute distress.    Appearance: He is well-developed.  Cardiovascular:     Rate and Rhythm: Normal rate and regular rhythm.     Heart sounds:  Normal heart sounds.  Pulmonary:     Effort: Pulmonary effort is normal.     Breath sounds: Normal breath sounds.  Neurological:     Mental Status: He is alert.          Scheduled Medications:   aspirin EC  81 mg Oral Daily   clopidogrel  75 mg Oral Daily   guaiFENesin  600 mg Oral BID   losartan  25 mg Oral Daily   metoprolol succinate  50 mg Oral Daily   nitroGLYCERIN  1 inch Topical Q8H   rosuvastatin  40 mg Oral Daily   sodium chloride flush  3 mL  Intravenous Q12H    Continuous Infusions:  sodium chloride Stopped (08/23/22 0121)    PRN Medications:  acetaminophen, albuterol, chlorpheniramine-HYDROcodone, nitroGLYCERIN, ondansetron (ZOFRAN) IV, sodium chloride flush  Antimicrobials from admission:  Anti-infectives (From admission, onward)    None           Data Reviewed:  I have personally reviewed the following...  CBC: Recent Labs  Lab 08/22/22 2349 08/23/22 0610 08/23/22 0804 08/23/22 1105  WBC 15.4*  --  11.9*  --   HGB 14.7 13.5 13.2 12.7*  HCT 42.6 40.0 39.9 37.0*  MCV 81.6  --  85.1  --   PLT 291  --  240  --    Basic Metabolic Panel: Recent Labs  Lab 08/22/22 2349 08/23/22 0804  NA 129* 130*  K 3.2* 3.8  CL 93* 100  CO2 23 21*  GLUCOSE 296* 194*  BUN 19 16  CREATININE 1.62* 1.31*  CALCIUM 9.5 8.6*   GFR: Estimated Creatinine Clearance: 73.5 mL/min (A) (by C-G formula based on SCr of 1.31 mg/dL (H)). Liver Function Tests: Recent Labs  Lab 08/22/22 2349  AST 46*  ALT 31  ALKPHOS 60  BILITOT 1.0  PROT 7.4  ALBUMIN 3.9   No results for input(s): "LIPASE", "AMYLASE" in the last 168 hours. No results for input(s): "AMMONIA" in the last 168 hours. Coagulation Profile: Recent Labs  Lab 08/23/22 0610  INR 1.0   Cardiac Enzymes: No results for input(s): "CKTOTAL", "CKMB", "CKMBINDEX", "TROPONINI" in the last 168 hours. BNP (last 3 results) No results for input(s): "PROBNP" in the last 8760 hours. HbA1C: No results for input(s): "HGBA1C" in the last 72 hours. CBG: Recent Labs  Lab 08/23/22 0244  GLUCAP 249*   Lipid Profile: No results for input(s): "CHOL", "HDL", "LDLCALC", "TRIG", "CHOLHDL", "LDLDIRECT" in the last 72 hours. Thyroid Function Tests: No results for input(s): "TSH", "T4TOTAL", "FREET4", "T3FREE", "THYROIDAB" in the last 72 hours. Anemia Panel: No results for input(s): "VITAMINB12", "FOLATE", "FERRITIN", "TIBC", "IRON", "RETICCTPCT" in the last 72 hours. Most  Recent Urinalysis On File:     Component Value Date/Time   COLORURINE YELLOW (A) 03/20/2017 0005   APPEARANCEUR CLEAR (A) 03/20/2017 0005   APPEARANCEUR Hazy 04/17/2012 1740   LABSPEC 1.017 03/20/2017 0005   LABSPEC 1.020 04/17/2012 1740   PHURINE 7.0 03/20/2017 0005   GLUCOSEU NEGATIVE 03/20/2017 0005   GLUCOSEU Negative 04/17/2012 1740   HGBUR SMALL (A) 03/20/2017 0005   BILIRUBINUR NEGATIVE 03/20/2017 0005   BILIRUBINUR Negative 04/17/2012 1740   KETONESUR NEGATIVE 03/20/2017 0005   PROTEINUR NEGATIVE 03/20/2017 0005   NITRITE NEGATIVE 03/20/2017 0005   LEUKOCYTESUR NEGATIVE 03/20/2017 0005   LEUKOCYTESUR Negative 04/17/2012 1740   Sepsis Labs: @LABRCNTIP (procalcitonin:4,lacticidven:4) Microbiology: Recent Results (from the past 240 hour(s))  Resp panel by RT-PCR (RSV, Flu A&B, Covid) Anterior Nasal Swab  Status: None   Collection Time: 08/22/22 11:49 PM   Specimen: Anterior Nasal Swab  Result Value Ref Range Status   SARS Coronavirus 2 by RT PCR NEGATIVE NEGATIVE Final    Comment: (NOTE) SARS-CoV-2 target nucleic acids are NOT DETECTED.  The SARS-CoV-2 RNA is generally detectable in upper respiratory specimens during the acute phase of infection. The lowest concentration of SARS-CoV-2 viral copies this assay can detect is 138 copies/mL. A negative result does not preclude SARS-Cov-2 infection and should not be used as the sole basis for treatment or other patient management decisions. A negative result may occur with  improper specimen collection/handling, submission of specimen other than nasopharyngeal swab, presence of viral mutation(s) within the areas targeted by this assay, and inadequate number of viral copies(<138 copies/mL). A negative result must be combined with clinical observations, patient history, and epidemiological information. The expected result is Negative.  Fact Sheet for Patients:  BloggerCourse.com  Fact Sheet for  Healthcare Providers:  SeriousBroker.it  This test is no t yet approved or cleared by the [place] FDA and  has been authorized for detection and/or diagnosis of SARS-CoV-2 by FDA under an Emergency Use Authorization (EUA). This EUA will remain  in effect (meaning this test can be used) for the duration of the COVID-19 declaration under Section 564(b)(1) of the Act, 21 U.S.C.section 360bbb-3(b)(1), unless the authorization is terminated  or revoked sooner.       Influenza A by PCR NEGATIVE NEGATIVE Final   Influenza B by PCR NEGATIVE NEGATIVE Final    Comment: (NOTE) The Xpert Xpress SARS-CoV-2/FLU/RSV plus assay is intended as an aid in the diagnosis of influenza from Nasopharyngeal swab specimens and should not be used as a sole basis for treatment. Nasal washings and aspirates are unacceptable for Xpert Xpress SARS-CoV-2/FLU/RSV testing.  Fact Sheet for Patients: BloggerCourse.com  Fact Sheet for Healthcare Providers: SeriousBroker.it  This test is not yet approved or cleared by the [place] FDA and has been authorized for detection and/or diagnosis of SARS-CoV-2 by FDA under an Emergency Use Authorization (EUA). This EUA will remain in effect (meaning this test can be used) for the duration of the COVID-19 declaration under Section 564(b)(1) of the Act, 21 U.S.C. section 360bbb-3(b)(1), unless the authorization is terminated or revoked.     Resp Syncytial Virus by PCR NEGATIVE NEGATIVE Final    Comment: (NOTE) Fact Sheet for Patients: BloggerCourse.com  Fact Sheet for Healthcare Providers: SeriousBroker.it  This test is not yet approved or cleared by the [place] FDA and has been authorized for detection and/or diagnosis of SARS-CoV-2 by FDA under an Emergency Use Authorization (EUA). This EUA will remain in effect (meaning this  test can be used) for the duration of the COVID-19 declaration under Section 564(b)(1) of the Act, 21 U.S.C. section 360bbb-3(b)(1), unless the authorization is terminated or revoked.  Performed at [facility], [address] [place], [place] [address]       Radiology Studies last 3 days: CARDIAC CATHETERIZATION  Result Date: 08/23/2022   Prox RCA-2 lesion is 20% stenosed.   Mid RCA-2 lesion is 100% stenosed.   1st Mrg lesion is 90% stenosed.   Mid Cx lesion is 30% stenosed.   Mid LAD lesion is 20% stenosed.   Prox RCA-1 lesion is 30% stenosed.   Mid RCA-1 lesion is 30% stenosed.   A drug-eluting stent was successfully placed using a STENT ONYX FRONTIER [id number].   Post intervention, there is a 0% residual stenosis. 1.  Inferior ST elevation myocardial infarction due to thrombotic occlusion of the mid/distal right coronary artery.  Patent stents in the proximal RCA and mid LAD with mild in-stent restenosis.  No other obstructive disease. 2.  Left ventricular angiography was not performed due to chronic kidney disease.  LVEDP was mildly elevated. 3.  Successful angioplasty and drug-eluting stent placement to the right coronary artery. Recommendations: Continue dual antiplatelet therapy for at least 12 months.  The patient did not tolerate ticagrelor in the past very well and thus I elected not to switch him. Obtain an echocardiogram to evaluate ejection fraction. Aggressive treatment of risk factors and smoking cessation. Please note that the patient's initial EKG on arrival was not diagnostic for STEMI.  However, he had recurrent chest pain and a repeat EKG was consistent with inferior STEMI and thus emergent cath was recommended.   DG Chest Port 1 View  Result Date: 08/23/2022 CLINICAL DATA:  Chest pain EXAM: PORTABLE CHEST 1 VIEW COMPARISON:  03/19/2020 FINDINGS: Heart and mediastinal contours are within normal limits. No focal opacities or effusions. No acute bony abnormality. No  pneumothorax. IMPRESSION: No active disease. Electronically Signed   By: Rolm Baptise M.D.   On: 08/23/2022 00:18             LOS: 0 days       Emeterio Reeve, DO [facility] 08/23/2022, 1:52 PM    Dictation software may have been used to generate the above note. Typos may occur and escape review in typed/dictated notes. Please contact Dr Sheppard Coil directly for clarity if needed.  Staff may message me via secure chat in [place]  but this may not receive an immediate response,  please page me for urgent matters!  If 7PM-7AM, please contact night coverage [email or web address]

## 2022-08-23 NOTE — Progress Notes (Signed)
Patient asking to leave AMA. Mansy, MD notified and MD discussed risks of leaving AMA with this patient. Patient refused to stay overnight citing personal issues at home. IVs removed, tele d/c'd, and AMA form completed.   Earleen Reaper, RN

## 2022-08-23 NOTE — Progress Notes (Signed)
ANTICOAGULATION CONSULT NOTE  Pharmacy Consult for heparin infusion Indication: ACS/STEMI  Allergies  Allergen Reactions   Penicillins     Patient Measurements: Height: 5' 11.5" (181.6 cm) Weight: 102.1 kg (225 lb) IBW/kg (Calculated) : 76.45 Heparin Dosing Weight: 97.5 kg  Vital Signs: Temp: 98.8 F (37.1 C) (01/13 2335) Temp Source: Oral (01/13 2335) BP: 134/75 (01/14 0000) Pulse Rate: 77 (01/14 0000)  Labs: Recent Labs    08/22/22 2349  HGB 14.7  HCT 42.6  PLT 291  CREATININE 1.62*  TROPONINIHS 6,098*    Estimated Creatinine Clearance: 60.2 mL/min (A) (by C-G formula based on SCr of 1.62 mg/dL (H)).   Medical History: Past Medical History:  Diagnosis Date   Coronary artery disease    Gallstones 2019   Hyperlipidemia    Hypertension    Kidney stones 2019    Assessment: Pt is a 62 yo male presenting to ED c/o chest pain, found with elevated Troponin I level.  Goal of Therapy:  Heparin level 0.3-0.7 units/ml Monitor platelets by anticoagulation protocol: Yes   Plan:  Pt given 4000 units bolus prior to BL labs Start heparin infusion at 1250 units/hr Will check HL in 6 hr after start of infusion CBC daily while on heparin  Renda Rolls, PharmD, [facility] 08/23/2022 1:08 AM

## 2022-08-24 ENCOUNTER — Encounter: Payer: Self-pay | Admitting: Cardiovascular Disease

## 2022-08-24 MED ORDER — IOHEXOL 300 MG/ML  SOLN
INTRAMUSCULAR | Status: AC | PRN
  Administered 2022-08-23: 88 mL

## 2022-08-24 NOTE — Discharge Summary (Signed)
Physician Discharge Summary   Patient: Anthony Mcguire MRN: [id number]  DOB: 08-Sep-1960   Admit:     Date of Admission: 08/22/2022 Admitted from: hom   Discharge: Date of discharge: 08/23/2022 Disposition: Home Condition at discharge: fair  CODE STATUS: FULL CODE     Discharge Physician: Sunnie Nielsen, DO [facility]     PCP: Yevonne Pax, MD  Recommendations for Outpatient Follow-up:  Follow up with PCP Yevonne Pax, MD    Discharge Instructions     AMB Referral to Cardiac Rehabilitation - Phase II   Complete by: As directed    Diagnosis:  STEMI Coronary Stents     After initial evaluation and assessments completed: Virtual Based Care may be provided alone or in conjunction with Phase 2 Cardiac Rehab based on patient barriers.: Yes   Intensive Cardiac Rehabilitation (ICR) [facility] location only OR Traditional Cardiac Rehabilitation (TCR) *If criteria for ICR are not met will enroll in TCR [facility] only): Yes         Discharge Diagnoses: Active Problems:   ST-segment elevation myocardial infarction (STEMI) of inferior wall (HCC)   Hemoptysis   Essential hypertension   Dyslipidemia   ST elevation myocardial infarction involving right coronary artery [facility])       Hospital Course: Anthony Mcguire is a 62 y.o.  male with medical history significant for coronary artery disease, hypertension, dyslipidemia, urolithiasis and cholelithiasis, who presented to the emergency room late night hours 08/22/2021 with acute onset of intermittent chest pain over the last week but got significantly worse since last night with associated nausea and abdominal pain.    01/13-01/14: in ED repeat EKG showed 1 mm ST segment elevation inferiorly with reciprocal changes laterally.  Code STEMI was activated and the patient was taken to the Cath Lab by Dr. Kirke Corin and underwent PCI and stent placement for RCA occlusion. BP was 145/83 with otherwise normal vital signs.  Labs revealed  hyponatremia and hypokalemia and hypochloremia with hyperglycemia and creatinine 1.62 close to baseline and high sensitive troponin I was 6,098 and later 10,509. Cardiology following. ANticipate d/c next 24-48h if stable and refer to cardiac rehab  Late evening 08/23/22 patient left hospital AMA   Consultants:  Cardiology  Procedures: 08/23/22 Cardiac catheterization w/ R PCA PCI      ASSESSMENT & PLAN:   Active Problems:   ST-segment elevation myocardial infarction (STEMI) of inferior wall (HCC)   Hemoptysis   Essential hypertension   Dyslipidemia   ST elevation myocardial infarction involving right coronary artery (HCC)   ST-segment elevation myocardial infarction (STEMI) of inferior wall (HCC) Essential hypertension Dyslipidemia continue on aspirin and Plavix  High potency statin, beta blocker, ARB Follow outpatient cardiology  Hemoptysis mild and self-limited. aspirin and Plavix with benefits outweighing risks s/p PCI and stent.            Discharge Instructions  Allergies as of 08/23/2022       Reactions   Penicillins         Medication List     ASK your doctor about these medications    albuterol 108 (90 Base) MCG/ACT inhaler Commonly known as: VENTOLIN HFA Inhale 2 puffs into the lungs every 6 (six) hours as needed for wheezing or shortness of breath.   aspirin EC 81 MG tablet Take 81 mg by mouth daily. Swallow whole.   clopidogrel 75 MG tablet Commonly known as: PLAVIX Take 75 mg by mouth daily.   cyclobenzaprine 10 MG tablet Commonly  known as: FLEXERIL Take 1 tablet (10 mg total) by mouth 3 (three) times daily as needed for muscle spasms.   diclofenac sodium 1 % Gel Commonly known as: VOLTAREN Apply 2 g topically 4 (four) times daily.   ibuprofen 800 MG tablet Commonly known as: ADVIL Take 1 tablet (800 mg total) by mouth every 8 (eight) hours as needed for moderate pain.   losartan-hydrochlorothiazide 50-12.5 MG  tablet Commonly known as: HYZAAR Take 1 tablet by mouth daily.   metoprolol succinate 50 MG 24 hr tablet Commonly known as: TOPROL-XL Take 50 mg by mouth daily. Take with or immediately following a meal.   ondansetron 4 MG disintegrating tablet Commonly known as: Zofran ODT Take 1 tablet (4 mg total) by mouth every 8 (eight) hours as needed for nausea or vomiting.   oxyCODONE-acetaminophen 5-325 MG tablet Commonly known as: Roxicet Take 1 tablet by mouth every 4 (four) hours as needed for severe pain.   rosuvastatin 40 MG tablet Commonly known as: CRESTOR Take 40 mg by mouth daily.          Allergies  Allergen Reactions   Penicillins      Subjective: n/a - patient left AMA overnight    Discharge Exam: BP (!) 109/58 (BP Location: Right Arm)   Pulse 77   Temp 99.7 F (37.6 C) (Oral)   Resp (!) 24   Ht 5' 11.5" (1.816 m)   Wt 99.2 kg   SpO2 99%   BMI 30.08 kg/m  Exam unable to be completed by me, patietn left AMA overnight      The results of significant diagnostics from this hospitalization (including imaging, microbiology, ancillary and laboratory) are listed below for reference.     Microbiology: Recent Results (from the past 240 hour(s))  Resp panel by RT-PCR (RSV, Flu A&B, Covid) Anterior Nasal Swab     Status: None   Collection Time: 08/22/22 11:49 PM   Specimen: Anterior Nasal Swab  Result Value Ref Range Status   SARS Coronavirus 2 by RT PCR NEGATIVE NEGATIVE Final    Comment: (NOTE) SARS-CoV-2 target nucleic acids are NOT DETECTED.  The SARS-CoV-2 RNA is generally detectable in upper respiratory specimens during the acute phase of infection. The lowest concentration of SARS-CoV-2 viral copies this assay can detect is 138 copies/mL. A negative result does not preclude SARS-Cov-2 infection and should not be used as the sole basis for treatment or other patient management decisions. A negative result may occur with  improper specimen  collection/handling, submission of specimen other than nasopharyngeal swab, presence of viral mutation(s) within the areas targeted by this assay, and inadequate number of viral copies(<138 copies/mL). A negative result must be combined with clinical observations, patient history, and epidemiological information. The expected result is Negative.  Fact Sheet for Patients:  EntrepreneurPulse.com.au  Fact Sheet for Healthcare Providers:  IncredibleEmployment.be  This test is no t yet approved or cleared by the [place] FDA and  has been authorized for detection and/or diagnosis of SARS-CoV-2 by FDA under an Emergency Use Authorization (EUA). This EUA will remain  in effect (meaning this test can be used) for the duration of the COVID-19 declaration under Section 564(b)(1) of the Act, 21 U.S.C.section 360bbb-3(b)(1), unless the authorization is terminated  or revoked sooner.       Influenza A by PCR NEGATIVE NEGATIVE Final   Influenza B by PCR NEGATIVE NEGATIVE Final    Comment: (NOTE) The Xpert Xpress SARS-CoV-2/FLU/RSV plus assay is intended as an aid  in the diagnosis of influenza from Nasopharyngeal swab specimens and should not be used as a sole basis for treatment. Nasal washings and aspirates are unacceptable for Xpert Xpress SARS-CoV-2/FLU/RSV testing.  Fact Sheet for Patients: BloggerCourse.com  Fact Sheet for Healthcare Providers: SeriousBroker.it  This test is not yet approved or cleared by the [place] FDA and has been authorized for detection and/or diagnosis of SARS-CoV-2 by FDA under an Emergency Use Authorization (EUA). This EUA will remain in effect (meaning this test can be used) for the duration of the COVID-19 declaration under Section 564(b)(1) of the Act, 21 U.S.C. section 360bbb-3(b)(1), unless the authorization is terminated or revoked.     Resp Syncytial  Virus by PCR NEGATIVE NEGATIVE Final    Comment: (NOTE) Fact Sheet for Patients: BloggerCourse.com  Fact Sheet for Healthcare Providers: SeriousBroker.it  This test is not yet approved or cleared by the [place] FDA and has been authorized for detection and/or diagnosis of SARS-CoV-2 by FDA under an Emergency Use Authorization (EUA). This EUA will remain in effect (meaning this test can be used) for the duration of the COVID-19 declaration under Section 564(b)(1) of the Act, 21 U.S.C. section 360bbb-3(b)(1), unless the authorization is terminated or revoked.  Performed at [facility], [address] [place], [place] [address]      Labs: BNP (last 3 results) No results for input(s): "BNP" in the last 8760 hours. Basic Metabolic Panel: Recent Labs  Lab 08/22/22 2349 08/23/22 0804  NA 129* 130*  K 3.2* 3.8  CL 93* 100  CO2 23 21*  GLUCOSE 296* 194*  BUN 19 16  CREATININE 1.62* 1.31*  CALCIUM 9.5 8.6*   Liver Function Tests: Recent Labs  Lab 08/22/22 2349  AST 46*  ALT 31  ALKPHOS 60  BILITOT 1.0  PROT 7.4  ALBUMIN 3.9   No results for input(s): "LIPASE", "AMYLASE" in the last 168 hours. No results for input(s): "AMMONIA" in the last 168 hours. CBC: Recent Labs  Lab 08/22/22 2349 08/23/22 0610 08/23/22 0804 08/23/22 1105 08/23/22 1631  WBC 15.4*  --  11.9*  --   --   HGB 14.7 13.5 13.2 12.7* 12.6*  HCT 42.6 40.0 39.9 37.0* 37.6*  MCV 81.6  --  85.1  --   --   PLT 291  --  240  --   --    Cardiac Enzymes: No results for input(s): "CKTOTAL", "CKMB", "CKMBINDEX", "TROPONINI" in the last 168 hours. BNP: Invalid input(s): "POCBNP" CBG: Recent Labs  Lab 08/23/22 0244 08/23/22 2109  GLUCAP 249* 208*   D-Dimer No results for input(s): "DDIMER" in the last 72 hours. Hgb A1c No results for input(s): "HGBA1C" in the last 72 hours. Lipid Profile No results for input(s): "CHOL", "HDL",  "LDLCALC", "TRIG", "CHOLHDL", "LDLDIRECT" in the last 72 hours. Thyroid function studies No results for input(s): "TSH", "T4TOTAL", "T3FREE", "THYROIDAB" in the last 72 hours.  Invalid input(s): "FREET3" Anemia work up No results for input(s): "VITAMINB12", "FOLATE", "FERRITIN", "TIBC", "IRON", "RETICCTPCT" in the last 72 hours. Urinalysis    Component Value Date/Time   COLORURINE YELLOW (A) 03/20/2017 0005   APPEARANCEUR CLEAR (A) 03/20/2017 0005   APPEARANCEUR Hazy 04/17/2012 1740   LABSPEC 1.017 03/20/2017 0005   LABSPEC 1.020 04/17/2012 1740   PHURINE 7.0 03/20/2017 0005   GLUCOSEU NEGATIVE 03/20/2017 0005   GLUCOSEU Negative 04/17/2012 1740   HGBUR SMALL (A) 03/20/2017 0005   BILIRUBINUR NEGATIVE 03/20/2017 0005   BILIRUBINUR Negative 04/17/2012 1740   KETONESUR  NEGATIVE 03/20/2017 0005   PROTEINUR NEGATIVE 03/20/2017 0005   NITRITE NEGATIVE 03/20/2017 0005   LEUKOCYTESUR NEGATIVE 03/20/2017 0005   LEUKOCYTESUR Negative 04/17/2012 1740   Sepsis Labs Recent Labs  Lab 08/22/22 2349 08/23/22 0804  WBC 15.4* 11.9*   Microbiology Recent Results (from the past 240 hour(s))  Resp panel by RT-PCR (RSV, Flu A&B, Covid) Anterior Nasal Swab     Status: None   Collection Time: 08/22/22 11:49 PM   Specimen: Anterior Nasal Swab  Result Value Ref Range Status   SARS Coronavirus 2 by RT PCR NEGATIVE NEGATIVE Final    Comment: (NOTE) SARS-CoV-2 target nucleic acids are NOT DETECTED.  The SARS-CoV-2 RNA is generally detectable in upper respiratory specimens during the acute phase of infection. The lowest concentration of SARS-CoV-2 viral copies this assay can detect is 138 copies/mL. A negative result does not preclude SARS-Cov-2 infection and should not be used as the sole basis for treatment or other patient management decisions. A negative result may occur with  improper specimen collection/handling, submission of specimen other than nasopharyngeal swab, presence of viral  mutation(s) within the areas targeted by this assay, and inadequate number of viral copies(<138 copies/mL). A negative result must be combined with clinical observations, patient history, and epidemiological information. The expected result is Negative.  Fact Sheet for Patients:  EntrepreneurPulse.com.au  Fact Sheet for Healthcare Providers:  IncredibleEmployment.be  This test is no t yet approved or cleared by the [place] FDA and  has been authorized for detection and/or diagnosis of SARS-CoV-2 by FDA under an Emergency Use Authorization (EUA). This EUA will remain  in effect (meaning this test can be used) for the duration of the COVID-19 declaration under Section 564(b)(1) of the Act, 21 U.S.C.section 360bbb-3(b)(1), unless the authorization is terminated  or revoked sooner.       Influenza A by PCR NEGATIVE NEGATIVE Final   Influenza B by PCR NEGATIVE NEGATIVE Final    Comment: (NOTE) The Xpert Xpress SARS-CoV-2/FLU/RSV plus assay is intended as an aid in the diagnosis of influenza from Nasopharyngeal swab specimens and should not be used as a sole basis for treatment. Nasal washings and aspirates are unacceptable for Xpert Xpress SARS-CoV-2/FLU/RSV testing.  Fact Sheet for Patients: EntrepreneurPulse.com.au  Fact Sheet for Healthcare Providers: IncredibleEmployment.be  This test is not yet approved or cleared by the [place] FDA and has been authorized for detection and/or diagnosis of SARS-CoV-2 by FDA under an Emergency Use Authorization (EUA). This EUA will remain in effect (meaning this test can be used) for the duration of the COVID-19 declaration under Section 564(b)(1) of the Act, 21 U.S.C. section 360bbb-3(b)(1), unless the authorization is terminated or revoked.     Resp Syncytial Virus by PCR NEGATIVE NEGATIVE Final    Comment: (NOTE) Fact Sheet for  Patients: EntrepreneurPulse.com.au  Fact Sheet for Healthcare Providers: IncredibleEmployment.be  This test is not yet approved or cleared by the [place] FDA and has been authorized for detection and/or diagnosis of SARS-CoV-2 by FDA under an Emergency Use Authorization (EUA). This EUA will remain in effect (meaning this test can be used) for the duration of the COVID-19 declaration under Section 564(b)(1) of the Act, 21 U.S.C. section 360bbb-3(b)(1), unless the authorization is terminated or revoked.  Performed at [facility], [address] [place], [place] [address]    Imaging ECHOCARDIOGRAM COMPLETE  Result Date: 08/23/2022    ECHOCARDIOGRAM REPORT   Patient Name:   EDMUND Aylward Date of Exam: 08/23/2022 Medical Rec #:  [id number]       Height:       71.5 in Accession #:    [id number]      Weight:       218.7 lb Date of Birth:  12-09-1960       BSA:          2.202 m Patient Age:    61 years        BP:           95/71 mmHg Patient Gender: M               HR:           65 bpm. Exam Location:  [facility] Procedure: 2D Echo, Color Doppler, Cardiac Doppler and Intracardiac            Opacification Agent Indications:     Acute Myocardial Infarction  History:         Patient has no prior history of Echocardiogram examinations.                  Risk Factors:Hypertension.  Sonographer:     L. Thornton-Maynard Referring Phys:  [id number] MUHAMMAD A ARIDA Diagnosing Phys: Alwyn Pea MD  Sonographer Comments: Suboptimal parasternal window and suboptimal apical window. IMPRESSIONS  1. TDS.  2. Left ventricular ejection fraction, by estimation, is 65 to 70%. The left ventricle has normal function. The left ventricle has no regional wall motion abnormalities. Left ventricular diastolic parameters were normal.  3. Right ventricular systolic function is normal. The right ventricular size is normal.  4. The mitral valve is normal in structure. Trivial mitral  valve regurgitation.  5. The aortic valve is normal in structure. Aortic valve regurgitation is not visualized. Conclusion(s)/Recommendation(s): Poor windows for evaluation of left ventricular function by transthoracic echocardiography. Would recommend an alternative means of evaluation. FINDINGS  Left Ventricle: Left ventricular ejection fraction, by estimation, is 65 to 70%. The left ventricle has normal function. The left ventricle has no regional wall motion abnormalities. Definity contrast agent was given IV to delineate the left ventricular  endocardial borders. The left ventricular internal cavity size was normal in size. There is no left ventricular hypertrophy. Left ventricular diastolic parameters were normal. Right Ventricle: The right ventricular size is normal. No increase in right ventricular wall thickness. Right ventricular systolic function is normal. Left Atrium: Left atrial size was normal in size. Right Atrium: Right atrial size was normal in size. Pericardium: There is no evidence of pericardial effusion. Mitral Valve: The mitral valve is normal in structure. Trivial mitral valve regurgitation. Tricuspid Valve: The tricuspid valve is normal in structure. Tricuspid valve regurgitation is trivial. Aortic Valve: The aortic valve is normal in structure. Aortic valve regurgitation is not visualized. Aortic valve mean gradient measures 2.0 mmHg. Aortic valve peak gradient measures 4.2 mmHg. Aortic valve area, by VTI measures 2.55 cm. Pulmonic Valve: The pulmonic valve was normal in structure. Pulmonic valve regurgitation is not visualized. Aorta: The ascending aorta was not well visualized. IAS/Shunts: No atrial level shunt detected by color flow Doppler. Additional Comments: TDS.  LEFT VENTRICLE PLAX 2D LVIDd:         4.10 cm      Diastology LVIDs:         3.40 cm      LV e' medial:    6.53 cm/s LV PW:         1.00 cm      LV E/e' medial:  11.9 LV IVS:  1.00 cm      LV e' lateral:   8.49 cm/s  LVOT diam:     2.05 cm      LV E/e' lateral: 9.1 LV SV:         51 LV SV Index:   23 LVOT Area:     3.30 cm  LV Volumes (MOD) LV vol d, MOD A2C: 63.4 ml LV vol d, MOD A4C: 116.0 ml LV vol s, MOD A2C: 22.3 ml LV vol s, MOD A4C: 33.6 ml LV SV MOD A2C:     41.1 ml LV SV MOD A4C:     116.0 ml LV SV MOD BP:      61.8 ml RIGHT VENTRICLE RV S prime:     8.70 cm/s TAPSE (M-mode): 1.5 cm LEFT ATRIUM             Index        RIGHT ATRIUM           Index LA Vol (A2C):   25.7 ml 11.67 ml/m  RA Area:     15.10 cm LA Vol (A4C):   15.8 ml 7.18 ml/m   RA Volume:   43.00 ml  19.53 ml/m LA Biplane Vol: 20.8 ml 9.45 ml/m  AORTIC VALVE AV Area (Vmax):    2.67 cm AV Area (Vmean):   2.41 cm AV Area (VTI):     2.55 cm AV Vmax:           103.00 cm/s AV Vmean:          68.800 cm/s AV VTI:            0.201 m AV Peak Grad:      4.2 mmHg AV Mean Grad:      2.0 mmHg LVOT Vmax:         83.30 cm/s LVOT Vmean:        50.300 cm/s LVOT VTI:          0.155 m LVOT/AV VTI ratio: 0.77  AORTA Ao Root diam: 3.65 cm Ao Asc diam:  3.50 cm MITRAL VALVE MV Area (PHT): 3.60 cm    SHUNTS MV Decel Time: 211 msec    Systemic VTI:  0.16 m MV E velocity: 77.60 cm/s  Systemic Diam: 2.05 cm MV A velocity: 74.40 cm/s MV E/A ratio:  1.04 Alwyn Peawayne D Callwood MD Electronically signed by Alwyn Peawayne D Callwood MD Signature Date/Time: 08/23/2022/3:09:09 PM    Final    CARDIAC CATHETERIZATION  Result Date: 08/23/2022   Prox RCA-2 lesion is 20% stenosed.   Mid RCA-2 lesion is 100% stenosed.   1st Mrg lesion is 90% stenosed.   Mid Cx lesion is 30% stenosed.   Mid LAD lesion is 20% stenosed.   Prox RCA-1 lesion is 30% stenosed.   Mid RCA-1 lesion is 30% stenosed.   A drug-eluting stent was successfully placed using a STENT ONYX FRONTIER [id number].   Post intervention, there is a 0% residual stenosis. 1.  Inferior ST elevation myocardial infarction due to thrombotic occlusion of the mid/distal right coronary artery.  Patent stents in the proximal RCA and mid LAD with mild  in-stent restenosis.  No other obstructive disease. 2.  Left ventricular angiography was not performed due to chronic kidney disease.  LVEDP was mildly elevated. 3.  Successful angioplasty and drug-eluting stent placement to the right coronary artery. Recommendations: Continue dual antiplatelet therapy for at least 12 months.  The patient did not tolerate ticagrelor in the past very well and  thus I elected not to switch him. Obtain an echocardiogram to evaluate ejection fraction. Aggressive treatment of risk factors and smoking cessation. Please note that the patient's initial EKG on arrival was not diagnostic for STEMI.  However, he had recurrent chest pain and a repeat EKG was consistent with inferior STEMI and thus emergent cath was recommended.   DG Chest Port 1 View  Result Date: 08/23/2022 CLINICAL DATA:  Chest pain EXAM: PORTABLE CHEST 1 VIEW COMPARISON:  03/19/2020 FINDINGS: Heart and mediastinal contours are within normal limits. No focal opacities or effusions. No acute bony abnormality. No pneumothorax. IMPRESSION: No active disease. Electronically Signed   By: Charlett NoseKevin  Dover M.D.   On: 08/23/2022 00:18      Time coordinating discharge: less than 30 minutes  SIGNED:  Sunnie NielsenNatalie Adja Ruff DO [facility]

## 2022-08-25 LAB — BPAM RBC
Blood Product Expiration Date: 202402112359
Blood Product Expiration Date: 202402112359
Unit Type and Rh: 6200
Unit Type and Rh: 6200

## 2022-08-25 LAB — TYPE AND SCREEN
ABO/RH(D): A POS
Antibody Screen: POSITIVE
Unit division: 0
Unit division: 0

## 2022-08-25 LAB — LIPOPROTEIN A (LPA): Lipoprotein (a): 12.2 nmol/L (ref <75.0)

## 2022-09-18 ENCOUNTER — Ambulatory Visit: Payer: Self-pay | Admitting: Cardiovascular Disease

## 2022-10-27 ENCOUNTER — Other Ambulatory Visit: Payer: Self-pay | Admitting: Cardiology

## 2022-10-27 ENCOUNTER — Other Ambulatory Visit: Payer: Self-pay | Admitting: Cardiovascular Disease

## 2022-10-27 DIAGNOSIS — I10 Essential (primary) hypertension: Secondary | ICD-10-CM

## 2022-10-27 DIAGNOSIS — I200 Unstable angina: Secondary | ICD-10-CM
